# Patient Record
Sex: Female | Born: 1945 | Race: White | Hispanic: No | Marital: Single | Smoking: Never smoker
Health system: Southern US, Community
[De-identification: ages and names within clinical notes are randomized; demographics above are authoritative.]

## PROBLEM LIST (undated history)

## (undated) DIAGNOSIS — F0390 Unspecified dementia without behavioral disturbance: Secondary | ICD-10-CM

## (undated) DIAGNOSIS — E119 Type 2 diabetes mellitus without complications: Secondary | ICD-10-CM

---
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
## 2004-06-05 ENCOUNTER — Ambulatory Visit: Payer: Self-pay | Admitting: Obstetrics and Gynecology

## 2004-08-24 ENCOUNTER — Ambulatory Visit: Payer: Self-pay | Admitting: Unknown Physician Specialty

## 2005-02-01 ENCOUNTER — Emergency Department: Payer: Self-pay | Admitting: Unknown Physician Specialty

## 2005-04-19 ENCOUNTER — Other Ambulatory Visit: Payer: Self-pay

## 2005-04-19 ENCOUNTER — Inpatient Hospital Stay: Payer: Self-pay | Admitting: Internal Medicine

## 2005-05-17 ENCOUNTER — Ambulatory Visit: Payer: Self-pay | Admitting: Family Medicine

## 2005-07-31 ENCOUNTER — Ambulatory Visit: Payer: Self-pay | Admitting: Pain Medicine

## 2005-08-28 ENCOUNTER — Ambulatory Visit: Payer: Self-pay | Admitting: Pain Medicine

## 2005-10-28 ENCOUNTER — Encounter: Payer: Self-pay | Admitting: Orthopedic Surgery

## 2006-07-01 ENCOUNTER — Ambulatory Visit: Payer: Self-pay | Admitting: Obstetrics and Gynecology

## 2007-07-30 ENCOUNTER — Ambulatory Visit: Payer: Self-pay | Admitting: Internal Medicine

## 2007-08-10 ENCOUNTER — Other Ambulatory Visit: Payer: Self-pay

## 2007-08-10 ENCOUNTER — Inpatient Hospital Stay: Payer: Self-pay | Admitting: Surgery

## 2007-08-30 ENCOUNTER — Ambulatory Visit: Payer: Self-pay | Admitting: Internal Medicine

## 2007-09-09 ENCOUNTER — Ambulatory Visit: Payer: Self-pay | Admitting: Surgery

## 2008-02-25 ENCOUNTER — Ambulatory Visit: Payer: Self-pay | Admitting: Obstetrics and Gynecology

## 2009-11-29 ENCOUNTER — Ambulatory Visit: Payer: Self-pay | Admitting: Obstetrics and Gynecology

## 2011-01-23 ENCOUNTER — Ambulatory Visit: Payer: Self-pay | Admitting: Obstetrics and Gynecology

## 2011-07-07 ENCOUNTER — Emergency Department: Payer: Self-pay | Admitting: Unknown Physician Specialty

## 2012-02-25 ENCOUNTER — Ambulatory Visit: Payer: Self-pay | Admitting: Obstetrics and Gynecology

## 2013-03-04 ENCOUNTER — Ambulatory Visit: Payer: Self-pay | Admitting: Obstetrics and Gynecology

## 2014-03-28 ENCOUNTER — Ambulatory Visit: Payer: Self-pay | Admitting: Obstetrics and Gynecology

## 2014-09-09 ENCOUNTER — Ambulatory Visit: Payer: Self-pay | Admitting: Unknown Physician Specialty

## 2014-11-21 LAB — SURGICAL PATHOLOGY

## 2015-03-09 ENCOUNTER — Other Ambulatory Visit: Payer: Self-pay | Admitting: Obstetrics and Gynecology

## 2015-03-09 DIAGNOSIS — Z1231 Encounter for screening mammogram for malignant neoplasm of breast: Secondary | ICD-10-CM

## 2015-03-30 ENCOUNTER — Ambulatory Visit
Admission: RE | Admit: 2015-03-30 | Discharge: 2015-03-30 | Disposition: A | Discharge status: Home / Self Care | Payer: PPO | Source: Ambulatory Visit | Attending: Obstetrics and Gynecology | Admitting: Obstetrics and Gynecology

## 2015-03-30 DIAGNOSIS — Z1231 Encounter for screening mammogram for malignant neoplasm of breast: Secondary | ICD-10-CM | POA: Diagnosis present

## 2015-09-01 DIAGNOSIS — I10 Essential (primary) hypertension: Secondary | ICD-10-CM | POA: Diagnosis not present

## 2015-09-01 DIAGNOSIS — E785 Hyperlipidemia, unspecified: Secondary | ICD-10-CM | POA: Diagnosis not present

## 2015-09-01 DIAGNOSIS — E119 Type 2 diabetes mellitus without complications: Secondary | ICD-10-CM | POA: Diagnosis not present

## 2015-09-13 DIAGNOSIS — H25013 Cortical age-related cataract, bilateral: Secondary | ICD-10-CM | POA: Diagnosis not present

## 2015-09-13 DIAGNOSIS — E119 Type 2 diabetes mellitus without complications: Secondary | ICD-10-CM | POA: Diagnosis not present

## 2015-09-13 DIAGNOSIS — H5211 Myopia, right eye: Secondary | ICD-10-CM | POA: Diagnosis not present

## 2015-09-13 DIAGNOSIS — H35372 Puckering of macula, left eye: Secondary | ICD-10-CM | POA: Diagnosis not present

## 2015-09-28 DIAGNOSIS — H2511 Age-related nuclear cataract, right eye: Secondary | ICD-10-CM | POA: Diagnosis not present

## 2015-09-28 DIAGNOSIS — H25011 Cortical age-related cataract, right eye: Secondary | ICD-10-CM | POA: Diagnosis not present

## 2015-09-28 DIAGNOSIS — H25811 Combined forms of age-related cataract, right eye: Secondary | ICD-10-CM | POA: Diagnosis not present

## 2016-02-16 ENCOUNTER — Other Ambulatory Visit: Payer: Self-pay | Admitting: Family Medicine

## 2016-02-16 DIAGNOSIS — Z1231 Encounter for screening mammogram for malignant neoplasm of breast: Secondary | ICD-10-CM

## 2016-03-12 DIAGNOSIS — Z1211 Encounter for screening for malignant neoplasm of colon: Secondary | ICD-10-CM | POA: Diagnosis not present

## 2016-03-12 DIAGNOSIS — Z124 Encounter for screening for malignant neoplasm of cervix: Secondary | ICD-10-CM | POA: Diagnosis not present

## 2016-03-12 DIAGNOSIS — Z01419 Encounter for gynecological examination (general) (routine) without abnormal findings: Secondary | ICD-10-CM | POA: Diagnosis not present

## 2016-03-21 DIAGNOSIS — Z0000 Encounter for general adult medical examination without abnormal findings: Secondary | ICD-10-CM | POA: Diagnosis not present

## 2016-03-21 DIAGNOSIS — E119 Type 2 diabetes mellitus without complications: Secondary | ICD-10-CM | POA: Diagnosis not present

## 2016-03-21 DIAGNOSIS — E785 Hyperlipidemia, unspecified: Secondary | ICD-10-CM | POA: Diagnosis not present

## 2016-03-21 DIAGNOSIS — I10 Essential (primary) hypertension: Secondary | ICD-10-CM | POA: Diagnosis not present

## 2016-04-02 ENCOUNTER — Ambulatory Visit
Admission: RE | Admit: 2016-04-02 | Discharge: 2016-04-02 | Disposition: A | Discharge status: Home / Self Care | Payer: PPO | Source: Ambulatory Visit | Attending: Family Medicine | Admitting: Family Medicine

## 2016-04-02 ENCOUNTER — Other Ambulatory Visit: Payer: Self-pay | Admitting: Family Medicine

## 2016-04-02 DIAGNOSIS — Z1231 Encounter for screening mammogram for malignant neoplasm of breast: Secondary | ICD-10-CM | POA: Insufficient documentation

## 2016-05-07 DIAGNOSIS — R748 Abnormal levels of other serum enzymes: Secondary | ICD-10-CM | POA: Diagnosis not present

## 2016-09-20 DIAGNOSIS — E785 Hyperlipidemia, unspecified: Secondary | ICD-10-CM | POA: Diagnosis not present

## 2016-09-20 DIAGNOSIS — E119 Type 2 diabetes mellitus without complications: Secondary | ICD-10-CM | POA: Diagnosis not present

## 2016-09-20 DIAGNOSIS — I10 Essential (primary) hypertension: Secondary | ICD-10-CM | POA: Diagnosis not present

## 2016-12-24 ENCOUNTER — Emergency Department
Admission: EM | Admit: 2016-12-24 | Discharge: 2016-12-24 | Disposition: A | Discharge status: Home / Self Care | Payer: PPO | Attending: Emergency Medicine | Admitting: Emergency Medicine

## 2016-12-24 ENCOUNTER — Emergency Department: Payer: PPO

## 2016-12-24 DIAGNOSIS — R4182 Altered mental status, unspecified: Secondary | ICD-10-CM | POA: Diagnosis not present

## 2016-12-24 DIAGNOSIS — I10 Essential (primary) hypertension: Secondary | ICD-10-CM | POA: Diagnosis not present

## 2016-12-24 DIAGNOSIS — N390 Urinary tract infection, site not specified: Secondary | ICD-10-CM | POA: Diagnosis not present

## 2016-12-24 DIAGNOSIS — R748 Abnormal levels of other serum enzymes: Secondary | ICD-10-CM | POA: Diagnosis not present

## 2016-12-24 DIAGNOSIS — R410 Disorientation, unspecified: Secondary | ICD-10-CM

## 2016-12-24 LAB — URINALYSIS, COMPLETE (UACMP) WITH MICROSCOPIC
Bilirubin Urine: NEGATIVE
Glucose, UA: NEGATIVE mg/dL
Hgb urine dipstick: NEGATIVE
Ketones, ur: NEGATIVE mg/dL
Nitrite: NEGATIVE
Protein, ur: NEGATIVE mg/dL
Specific Gravity, Urine: 1.004 — ABNORMAL LOW (ref 1.005–1.030)
pH: 7 (ref 5.0–8.0)

## 2016-12-24 LAB — COMPREHENSIVE METABOLIC PANEL
ALT: 40 U/L (ref 14–54)
AST: 38 U/L (ref 15–41)
Albumin: 4.8 g/dL (ref 3.5–5.0)
Alkaline Phosphatase: 61 U/L (ref 38–126)
Anion gap: 9 (ref 5–15)
BUN: 14 mg/dL (ref 6–20)
CO2: 26 mmol/L (ref 22–32)
Calcium: 10.1 mg/dL (ref 8.9–10.3)
Chloride: 105 mmol/L (ref 101–111)
Creatinine, Ser: 1 mg/dL (ref 0.44–1.00)
GFR calc Af Amer: >60 mL/min (ref >60)
GFR calc non Af Amer: 55 mL/min — ABNORMAL LOW (ref >60)
Glucose, Bld: 118 mg/dL — ABNORMAL HIGH (ref 65–99)
Potassium: 4 mmol/L (ref 3.5–5.1)
Sodium: 140 mmol/L (ref 135–145)
Total Bilirubin: 0.5 mg/dL (ref 0.3–1.2)
Total Protein: 7.6 g/dL (ref 6.5–8.1)

## 2016-12-24 LAB — CBC WITH DIFFERENTIAL/PLATELET
Basophils Absolute: 0 10*3/uL (ref 0–0.1)
Basophils Relative: 0 %
Eosinophils Absolute: 0.1 10*3/uL (ref 0–0.7)
Eosinophils Relative: 1 %
HCT: 38.4 % (ref 35.0–47.0)
Hemoglobin: 13.5 g/dL (ref 12.0–16.0)
Lymphocytes Relative: 16 %
Lymphs Abs: 1.1 10*3/uL (ref 1.0–3.6)
MCH: 32.9 pg (ref 26.0–34.0)
MCHC: 35.2 g/dL (ref 32.0–36.0)
MCV: 93.3 fL (ref 80.0–100.0)
Monocytes Absolute: 0.3 10*3/uL (ref 0.2–0.9)
Monocytes Relative: 5 %
Neutro Abs: 5.3 10*3/uL (ref 1.4–6.5)
Neutrophils Relative %: 78 %
Platelets: 152 10*3/uL (ref 150–440)
RBC: 4.11 MIL/uL (ref 3.80–5.20)
RDW: 12.3 % (ref 11.5–14.5)
WBC: 6.9 10*3/uL (ref 3.6–11.0)

## 2016-12-24 LAB — TROPONIN I: Troponin I: <0.03 ng/mL (ref <0.03)

## 2016-12-24 MED ORDER — CEFTRIAXONE SODIUM IN DEXTROSE 20 MG/ML IV SOLN
1.0000 g | Freq: Once | INTRAVENOUS | Status: AC
Start: 2016-12-24 — End: 2016-12-24
  Administered 2016-12-24: 1 g via INTRAVENOUS
  Filled 2016-12-24: qty 50

## 2016-12-24 MED ORDER — CEPHALEXIN 250 MG PO CAPS: 250.0000 mg | ORAL_CAPSULE | Freq: Four times a day (QID) | ORAL | 0 refills | Status: AC

## 2016-12-24 NOTE — ED Notes (Signed)
Pt resting in bed, returned from CT, awake and alert

## 2016-12-24 NOTE — ED Notes (Signed)
pts neighbor called for ride

## 2016-12-24 NOTE — ED Triage Notes (Signed)
Pt arrives via EMS from [facility], states she was there for a regular check up and they noticed AMS, diagnosed pt with a UTI and sent to ED, upon arrival pt oriented to name, unable to recall president, repeats "I am an outdoor person, I cut everyone's grass", denies any pain, awake and alert

## 2016-12-24 NOTE — ED Provider Notes (Signed)
[facility] Emergency Department Provider Note       Time seen: ----------------------------------------- 1:52 PM on 12/24/2016 -----------------------------------------     I have reviewed the triage vital signs and the nursing notes.   HISTORY   Chief Complaint Altered Mental Status    HPI Deborah Thornton is a 71 y.o. female who presents to the ED for altered mental status. Patient is sent via EMS from [facility] where she was there for regular checkup. They noticed altered mental status. She was diagnosed with UTI and sent to ER for evaluation. Upon arrival she has no complaints and states she feels fine.   No past medical history on file.  There are no active problems to display for this patient.   No past surgical history on file.  Allergies Patient has no known allergies.  Social History Social History  Substance Use Topics  . Smoking status: Not on file  . Smokeless tobacco: Not on file  . Alcohol use Not on file    Review of Systems Constitutional: Negative for fever. Eyes: Negative for vision changes ENT:  Negative for congestion, sore throat Cardiovascular: Negative for chest pain. Respiratory: Negative for shortness of breath. Gastrointestinal: Negative for abdominal pain, vomiting and diarrhea. Genitourinary: Negative for dysuria. Musculoskeletal: Negative for back pain. Skin: Negative for rash. Neurological: Negative for headaches, focal weakness or numbness.  All systems negative/normal/unremarkable except as stated in the HPI  ____________________________________________   PHYSICAL EXAM:  VITAL SIGNS: ED Triage Vitals  Enc Vitals Group     BP 12/24/16 1329 120/64     Pulse Rate 12/24/16 1329 63     Resp 12/24/16 1329 16     Temp 12/24/16 1329 97.7 F (36.5 C)     Temp Source 12/24/16 1329 Oral     SpO2 12/24/16 1329 100 %     Weight 12/24/16 1325 117 lb 11.6 oz (53.4 kg)     Height 12/24/16 1325 5'  2" (1.575 m)     Head Circumference --      Peak Flow --      Pain Score --      Pain Loc --      Pain Edu? --      Excl. in [place]? --     Constitutional: Alert But disoriented, Well appearing and in no distress. Eyes: Conjunctivae are normal. Normal extraocular movements. ENT   Head: Normocephalic and atraumatic.   Nose: No congestion/rhinnorhea.   Mouth/Throat: Mucous membranes are moist.   Neck: No stridor. Cardiovascular: Normal rate, regular rhythm. No murmurs, rubs, or gallops. Respiratory: Normal respiratory effort without tachypnea nor retractions. Breath sounds are clear and equal bilaterally. No wheezes/rales/rhonchi. Gastrointestinal: Soft and nontender. Normal bowel sounds Musculoskeletal: Nontender with normal range of motion in extremities. No lower extremity tenderness nor edema. Neurologic:  Normal speech and language. No gross focal neurologic deficits are appreciated.  Skin:  Skin is warm, dry and intact. No rash noted. Psychiatric: Mood and affect are normal. Speech and behavior are normal.  ____________________________________________  ED COURSE:  Pertinent labs & imaging results that were available during my care of the patient were reviewed by me and considered in my medical decision making (see chart for details). Patient presents for reported altered mental status, we will assess with labs and imaging as indicated.   Procedures ____________________________________________   LABS (pertinent positives/negatives)  Labs Reviewed  COMPREHENSIVE METABOLIC PANEL - Abnormal; Notable for the following:       Result Value  Glucose, Bld 118 (*)    GFR calc non Af Amer 55 (*)    All other components within normal limits  URINALYSIS, COMPLETE (UACMP) WITH MICROSCOPIC - Abnormal; Notable for the following:    Color, Urine STRAW (*)    APPearance CLEAR (*)    Specific Gravity, Urine 1.004 (*)    Leukocytes, UA SMALL (*)    Bacteria, UA RARE (*)     Squamous Epithelial / LPF 0-5 (*)    All other components within normal limits  CBC WITH DIFFERENTIAL/PLATELET  TROPONIN I  CBG MONITORING, ED    RADIOLOGY Images were viewed by me  CT head IMPRESSION:  Mild diffuse cortical atrophy. No acute intracranial abnormality  seen.      ____________________________________________  FINAL ASSESSMENT AND PLAN  Confusion, UTI  Plan: Patient's labs and imaging were dictated above. Patient had presented for concerns about her level of confusion. She appears mildly confused as if she is developing dementia but the symptoms are only mild at this point. She has excellent recall for most things and we have given her Rocephin for treatment of UTI. She'll be discharged to encourage taking Macrobid she was prescribed today.   [name], MD   Note: This note was generated in part or whole with voice recognition software. Voice recognition is usually quite accurate but there are transcription errors that can and very often do occur. I apologize for any typographical errors that were not detected and corrected.     [name], MD 12/24/16 [phone]

## 2016-12-24 NOTE — ED Notes (Signed)
Patient transported to CT 

## 2017-01-07 DIAGNOSIS — E538 Deficiency of other specified B group vitamins: Secondary | ICD-10-CM | POA: Diagnosis not present

## 2017-01-07 DIAGNOSIS — F0390 Unspecified dementia without behavioral disturbance: Secondary | ICD-10-CM | POA: Diagnosis not present

## 2017-01-07 DIAGNOSIS — R413 Other amnesia: Secondary | ICD-10-CM | POA: Diagnosis not present

## 2017-03-03 DIAGNOSIS — I10 Essential (primary) hypertension: Secondary | ICD-10-CM | POA: Diagnosis not present

## 2017-03-03 DIAGNOSIS — E785 Hyperlipidemia, unspecified: Secondary | ICD-10-CM | POA: Diagnosis not present

## 2017-03-03 DIAGNOSIS — E119 Type 2 diabetes mellitus without complications: Secondary | ICD-10-CM | POA: Diagnosis not present

## 2017-03-03 DIAGNOSIS — R413 Other amnesia: Secondary | ICD-10-CM | POA: Diagnosis not present

## 2017-03-13 ENCOUNTER — Other Ambulatory Visit: Payer: Self-pay | Admitting: Obstetrics and Gynecology

## 2017-03-13 DIAGNOSIS — Z1231 Encounter for screening mammogram for malignant neoplasm of breast: Secondary | ICD-10-CM | POA: Diagnosis not present

## 2017-03-13 DIAGNOSIS — Z1211 Encounter for screening for malignant neoplasm of colon: Secondary | ICD-10-CM | POA: Diagnosis not present

## 2017-03-13 DIAGNOSIS — Z124 Encounter for screening for malignant neoplasm of cervix: Secondary | ICD-10-CM | POA: Diagnosis not present

## 2017-04-07 ENCOUNTER — Ambulatory Visit
Admission: RE | Admit: 2017-04-07 | Discharge: 2017-04-07 | Disposition: A | Discharge status: Home / Self Care | Payer: PPO | Source: Ambulatory Visit | Attending: Obstetrics and Gynecology | Admitting: Obstetrics and Gynecology

## 2017-04-07 DIAGNOSIS — Z1231 Encounter for screening mammogram for malignant neoplasm of breast: Secondary | ICD-10-CM

## 2017-04-09 DIAGNOSIS — F0280 Dementia in other diseases classified elsewhere without behavioral disturbance: Secondary | ICD-10-CM | POA: Diagnosis not present

## 2017-04-09 DIAGNOSIS — G309 Alzheimer's disease, unspecified: Secondary | ICD-10-CM | POA: Diagnosis not present

## 2017-05-19 DIAGNOSIS — G309 Alzheimer's disease, unspecified: Secondary | ICD-10-CM | POA: Diagnosis not present

## 2017-05-19 DIAGNOSIS — I10 Essential (primary) hypertension: Secondary | ICD-10-CM | POA: Diagnosis not present

## 2017-05-19 DIAGNOSIS — F0280 Dementia in other diseases classified elsewhere without behavioral disturbance: Secondary | ICD-10-CM | POA: Diagnosis not present

## 2017-05-19 DIAGNOSIS — E119 Type 2 diabetes mellitus without complications: Secondary | ICD-10-CM | POA: Diagnosis not present

## 2017-07-26 DIAGNOSIS — Z9181 History of falling: Secondary | ICD-10-CM | POA: Diagnosis not present

## 2017-07-26 DIAGNOSIS — S99922A Unspecified injury of left foot, initial encounter: Secondary | ICD-10-CM | POA: Diagnosis not present

## 2017-07-26 DIAGNOSIS — M19072 Primary osteoarthritis, left ankle and foot: Secondary | ICD-10-CM | POA: Diagnosis not present

## 2017-07-26 DIAGNOSIS — S99912A Unspecified injury of left ankle, initial encounter: Secondary | ICD-10-CM | POA: Diagnosis not present

## 2017-09-12 DIAGNOSIS — E782 Mixed hyperlipidemia: Secondary | ICD-10-CM | POA: Diagnosis not present

## 2017-09-12 DIAGNOSIS — E118 Type 2 diabetes mellitus with unspecified complications: Secondary | ICD-10-CM | POA: Diagnosis not present

## 2017-09-12 DIAGNOSIS — I10 Essential (primary) hypertension: Secondary | ICD-10-CM | POA: Diagnosis not present

## 2017-09-19 DIAGNOSIS — I10 Essential (primary) hypertension: Secondary | ICD-10-CM | POA: Diagnosis not present

## 2017-09-19 DIAGNOSIS — E785 Hyperlipidemia, unspecified: Secondary | ICD-10-CM | POA: Diagnosis not present

## 2017-09-19 DIAGNOSIS — G309 Alzheimer's disease, unspecified: Secondary | ICD-10-CM | POA: Diagnosis not present

## 2017-09-19 DIAGNOSIS — E119 Type 2 diabetes mellitus without complications: Secondary | ICD-10-CM | POA: Diagnosis not present

## 2017-09-19 DIAGNOSIS — F0280 Dementia in other diseases classified elsewhere without behavioral disturbance: Secondary | ICD-10-CM | POA: Diagnosis not present

## 2017-10-07 DIAGNOSIS — R413 Other amnesia: Secondary | ICD-10-CM | POA: Diagnosis not present

## 2017-12-03 DIAGNOSIS — G309 Alzheimer's disease, unspecified: Secondary | ICD-10-CM | POA: Diagnosis not present

## 2017-12-03 DIAGNOSIS — E538 Deficiency of other specified B group vitamins: Secondary | ICD-10-CM | POA: Diagnosis not present

## 2017-12-03 DIAGNOSIS — F0280 Dementia in other diseases classified elsewhere without behavioral disturbance: Secondary | ICD-10-CM | POA: Diagnosis not present

## 2018-01-19 DIAGNOSIS — F0280 Dementia in other diseases classified elsewhere without behavioral disturbance: Secondary | ICD-10-CM | POA: Diagnosis not present

## 2018-01-19 DIAGNOSIS — I10 Essential (primary) hypertension: Secondary | ICD-10-CM | POA: Diagnosis not present

## 2018-01-19 DIAGNOSIS — E119 Type 2 diabetes mellitus without complications: Secondary | ICD-10-CM | POA: Diagnosis not present

## 2018-01-19 DIAGNOSIS — G309 Alzheimer's disease, unspecified: Secondary | ICD-10-CM | POA: Diagnosis not present

## 2018-01-19 DIAGNOSIS — E785 Hyperlipidemia, unspecified: Secondary | ICD-10-CM | POA: Diagnosis not present

## 2018-03-17 ENCOUNTER — Other Ambulatory Visit: Payer: Self-pay | Admitting: Obstetrics and Gynecology

## 2018-03-17 DIAGNOSIS — Z124 Encounter for screening for malignant neoplasm of cervix: Secondary | ICD-10-CM | POA: Diagnosis not present

## 2018-03-17 DIAGNOSIS — Z1231 Encounter for screening mammogram for malignant neoplasm of breast: Secondary | ICD-10-CM

## 2018-03-17 DIAGNOSIS — Z1211 Encounter for screening for malignant neoplasm of colon: Secondary | ICD-10-CM | POA: Diagnosis not present

## 2018-03-17 DIAGNOSIS — Z1239 Encounter for other screening for malignant neoplasm of breast: Secondary | ICD-10-CM | POA: Diagnosis not present

## 2018-03-23 DIAGNOSIS — Z1211 Encounter for screening for malignant neoplasm of colon: Secondary | ICD-10-CM | POA: Diagnosis not present

## 2018-04-08 ENCOUNTER — Ambulatory Visit
Admission: RE | Admit: 2018-04-08 | Discharge: 2018-04-08 | Disposition: A | Discharge status: Home / Self Care | Payer: PPO | Source: Ambulatory Visit | Attending: Obstetrics and Gynecology | Admitting: Obstetrics and Gynecology

## 2018-04-08 DIAGNOSIS — Z1231 Encounter for screening mammogram for malignant neoplasm of breast: Secondary | ICD-10-CM | POA: Diagnosis not present

## 2018-06-03 DIAGNOSIS — F0280 Dementia in other diseases classified elsewhere without behavioral disturbance: Secondary | ICD-10-CM | POA: Diagnosis not present

## 2018-06-03 DIAGNOSIS — G309 Alzheimer's disease, unspecified: Secondary | ICD-10-CM | POA: Diagnosis not present

## 2018-06-03 DIAGNOSIS — R413 Other amnesia: Secondary | ICD-10-CM | POA: Diagnosis not present

## 2018-09-08 DIAGNOSIS — R413 Other amnesia: Secondary | ICD-10-CM | POA: Diagnosis not present

## 2018-09-08 DIAGNOSIS — F0280 Dementia in other diseases classified elsewhere without behavioral disturbance: Secondary | ICD-10-CM | POA: Diagnosis not present

## 2018-09-08 DIAGNOSIS — G309 Alzheimer's disease, unspecified: Secondary | ICD-10-CM | POA: Diagnosis not present

## 2018-12-07 DIAGNOSIS — E119 Type 2 diabetes mellitus without complications: Secondary | ICD-10-CM | POA: Diagnosis not present

## 2018-12-07 DIAGNOSIS — G309 Alzheimer's disease, unspecified: Secondary | ICD-10-CM | POA: Diagnosis not present

## 2018-12-07 DIAGNOSIS — E785 Hyperlipidemia, unspecified: Secondary | ICD-10-CM | POA: Diagnosis not present

## 2018-12-07 DIAGNOSIS — I10 Essential (primary) hypertension: Secondary | ICD-10-CM | POA: Diagnosis not present

## 2018-12-10 DIAGNOSIS — E785 Hyperlipidemia, unspecified: Secondary | ICD-10-CM | POA: Diagnosis not present

## 2018-12-10 DIAGNOSIS — E119 Type 2 diabetes mellitus without complications: Secondary | ICD-10-CM | POA: Diagnosis not present

## 2018-12-10 DIAGNOSIS — I10 Essential (primary) hypertension: Secondary | ICD-10-CM | POA: Diagnosis not present

## 2020-03-31 DIAGNOSIS — R946 Abnormal results of thyroid function studies: Secondary | ICD-10-CM | POA: Diagnosis not present

## 2020-03-31 DIAGNOSIS — E119 Type 2 diabetes mellitus without complications: Secondary | ICD-10-CM | POA: Diagnosis not present

## 2020-06-26 DIAGNOSIS — R634 Abnormal weight loss: Secondary | ICD-10-CM | POA: Diagnosis not present

## 2020-06-26 DIAGNOSIS — Z1389 Encounter for screening for other disorder: Secondary | ICD-10-CM | POA: Diagnosis not present

## 2020-06-26 DIAGNOSIS — E039 Hypothyroidism, unspecified: Secondary | ICD-10-CM | POA: Diagnosis not present

## 2020-06-26 DIAGNOSIS — E119 Type 2 diabetes mellitus without complications: Secondary | ICD-10-CM | POA: Diagnosis not present

## 2020-06-26 DIAGNOSIS — E559 Vitamin D deficiency, unspecified: Secondary | ICD-10-CM | POA: Diagnosis not present

## 2020-06-26 DIAGNOSIS — I10 Essential (primary) hypertension: Secondary | ICD-10-CM | POA: Diagnosis not present

## 2020-06-26 DIAGNOSIS — Z23 Encounter for immunization: Secondary | ICD-10-CM | POA: Diagnosis not present

## 2020-06-26 DIAGNOSIS — Z0000 Encounter for general adult medical examination without abnormal findings: Secondary | ICD-10-CM | POA: Diagnosis not present

## 2020-07-06 ENCOUNTER — Encounter: Payer: Self-pay | Admitting: Emergency Medicine

## 2020-07-06 ENCOUNTER — Inpatient Hospital Stay
Admission: EM | Admit: 2020-07-06 | Discharge: 2020-07-08 | DRG: 639 | Disposition: A | Discharge status: Home / Self Care | Payer: Medicare Other | Attending: Family Medicine | Admitting: Family Medicine

## 2020-07-06 ENCOUNTER — Emergency Department: Payer: Medicare Other

## 2020-07-06 ENCOUNTER — Other Ambulatory Visit: Payer: Self-pay

## 2020-07-06 DIAGNOSIS — G309 Alzheimer's disease, unspecified: Secondary | ICD-10-CM | POA: Diagnosis not present

## 2020-07-06 DIAGNOSIS — E119 Type 2 diabetes mellitus without complications: Secondary | ICD-10-CM | POA: Diagnosis not present

## 2020-07-06 DIAGNOSIS — F0280 Dementia in other diseases classified elsewhere without behavioral disturbance: Secondary | ICD-10-CM | POA: Diagnosis present

## 2020-07-06 DIAGNOSIS — E039 Hypothyroidism, unspecified: Secondary | ICD-10-CM | POA: Diagnosis present

## 2020-07-06 DIAGNOSIS — E785 Hyperlipidemia, unspecified: Secondary | ICD-10-CM | POA: Diagnosis present

## 2020-07-06 DIAGNOSIS — R4182 Altered mental status, unspecified: Secondary | ICD-10-CM

## 2020-07-06 DIAGNOSIS — I10 Essential (primary) hypertension: Secondary | ICD-10-CM | POA: Diagnosis present

## 2020-07-06 DIAGNOSIS — E1110 Type 2 diabetes mellitus with ketoacidosis without coma: Principal | ICD-10-CM | POA: Diagnosis present

## 2020-07-06 DIAGNOSIS — Z7984 Long term (current) use of oral hypoglycemic drugs: Secondary | ICD-10-CM

## 2020-07-06 DIAGNOSIS — Z66 Do not resuscitate: Secondary | ICD-10-CM | POA: Diagnosis not present

## 2020-07-06 DIAGNOSIS — E1159 Type 2 diabetes mellitus with other circulatory complications: Secondary | ICD-10-CM | POA: Diagnosis present

## 2020-07-06 DIAGNOSIS — Z20822 Contact with and (suspected) exposure to covid-19: Secondary | ICD-10-CM | POA: Diagnosis present

## 2020-07-06 DIAGNOSIS — E1169 Type 2 diabetes mellitus with other specified complication: Secondary | ICD-10-CM | POA: Diagnosis present

## 2020-07-06 HISTORY — DX: Unspecified dementia, unspecified severity, without behavioral disturbance, psychotic disturbance, mood disturbance, and anxiety: F03.90

## 2020-07-06 HISTORY — DX: Type 2 diabetes mellitus without complications: E11.9

## 2020-07-06 LAB — BASIC METABOLIC PANEL
Anion gap: 23 — ABNORMAL HIGH (ref 5–15)
Anion gap: 20 — ABNORMAL HIGH (ref 5–15)
Anion gap: 13 (ref 5–15)
BUN: 35 mg/dL — ABNORMAL HIGH (ref 8–23)
BUN: 30 mg/dL — ABNORMAL HIGH (ref 8–23)
BUN: 23 mg/dL (ref 8–23)
CO2: 18 mmol/L — ABNORMAL LOW (ref 22–32)
CO2: 18 mmol/L — ABNORMAL LOW (ref 22–32)
CO2: 20 mmol/L — ABNORMAL LOW (ref 22–32)
Calcium: 10 mg/dL (ref 8.9–10.3)
Calcium: 9 mg/dL (ref 8.9–10.3)
Calcium: 8.3 mg/dL — ABNORMAL LOW (ref 8.9–10.3)
Chloride: 91 mmol/L — ABNORMAL LOW (ref 98–111)
Chloride: 97 mmol/L — ABNORMAL LOW (ref 98–111)
Chloride: 103 mmol/L (ref 98–111)
Creatinine, Ser: 1.07 mg/dL — ABNORMAL HIGH (ref 0.44–1.00)
Creatinine, Ser: 0.85 mg/dL (ref 0.44–1.00)
Creatinine, Ser: 0.59 mg/dL (ref 0.44–1.00)
GFR, Estimated: 55 mL/min — ABNORMAL LOW (ref >60)
GFR, Estimated: >60 mL/min (ref >60)
GFR, Estimated: >60 mL/min (ref >60)
Glucose, Bld: 537 mg/dL (ref 70–99)
Glucose, Bld: 456 mg/dL — ABNORMAL HIGH (ref 70–99)
Glucose, Bld: 272 mg/dL — ABNORMAL HIGH (ref 70–99)
Potassium: 5.1 mmol/L (ref 3.5–5.1)
Potassium: 4.7 mmol/L (ref 3.5–5.1)
Potassium: 3.9 mmol/L (ref 3.5–5.1)
Sodium: 132 mmol/L — ABNORMAL LOW (ref 135–145)
Sodium: 135 mmol/L (ref 135–145)
Sodium: 136 mmol/L (ref 135–145)

## 2020-07-06 LAB — CBG MONITORING, ED
Glucose-Capillary: 506 mg/dL (ref 70–99)
Glucose-Capillary: 443 mg/dL — ABNORMAL HIGH (ref 70–99)
Glucose-Capillary: 434 mg/dL — ABNORMAL HIGH (ref 70–99)
Glucose-Capillary: 282 mg/dL — ABNORMAL HIGH (ref 70–99)
Glucose-Capillary: 345 mg/dL — ABNORMAL HIGH (ref 70–99)
Glucose-Capillary: 250 mg/dL — ABNORMAL HIGH (ref 70–99)

## 2020-07-06 LAB — CBC
HCT: 43.9 % (ref 36.0–46.0)
Hemoglobin: 15.2 g/dL — ABNORMAL HIGH (ref 12.0–15.0)
MCH: 32.5 pg (ref 26.0–34.0)
MCHC: 34.6 g/dL (ref 30.0–36.0)
MCV: 94 fL (ref 80.0–100.0)
Platelets: 189 10*3/uL (ref 150–400)
RBC: 4.67 MIL/uL (ref 3.87–5.11)
RDW: 14.4 % (ref 11.5–15.5)
WBC: 8.9 10*3/uL (ref 4.0–10.5)
nRBC: 0 % (ref 0.0–0.2)

## 2020-07-06 LAB — URINALYSIS, COMPLETE (UACMP) WITH MICROSCOPIC
Bacteria, UA: NONE SEEN
Bilirubin Urine: NEGATIVE
Glucose, UA: >=500 mg/dL — AB
Ketones, ur: 80 mg/dL — AB
Nitrite: NEGATIVE
Protein, ur: NEGATIVE mg/dL
Specific Gravity, Urine: 1.027 (ref 1.005–1.030)
Squamous Epithelial / LPF: NONE SEEN (ref 0–5)
pH: 5 (ref 5.0–8.0)

## 2020-07-06 LAB — RESP PANEL BY RT-PCR (FLU A&B, COVID) ARPGX2
Influenza A by PCR: NEGATIVE
Influenza B by PCR: NEGATIVE
SARS Coronavirus 2 by RT PCR: NEGATIVE

## 2020-07-06 LAB — BETA-HYDROXYBUTYRIC ACID: Beta-Hydroxybutyric Acid: 5.63 mmol/L — ABNORMAL HIGH (ref 0.05–0.27)

## 2020-07-06 LAB — MAGNESIUM: Magnesium: 1.9 mg/dL (ref 1.7–2.4)

## 2020-07-06 MED ORDER — DONEPEZIL HCL 5 MG PO TABS
10.0000 mg | ORAL_TABLET | Freq: Every day | ORAL | Status: DC
  Administered 2020-07-07: 10 mg via ORAL
  Filled 2020-07-06: qty 2

## 2020-07-06 MED ORDER — DEXTROSE IN LACTATED RINGERS 5 % IV SOLN: INTRAVENOUS | Status: DC

## 2020-07-06 MED ORDER — DEXTROSE 50 % IV SOLN: 0.0000 mL | INTRAVENOUS | Status: DC | PRN

## 2020-07-06 MED ORDER — INSULIN REGULAR(HUMAN) IN NACL 100-0.9 UT/100ML-% IV SOLN
INTRAVENOUS | Status: DC
  Administered 2020-07-06: 6 [IU]/h via INTRAVENOUS
  Filled 2020-07-06: qty 100

## 2020-07-06 MED ORDER — LACTATED RINGERS IV BOLUS
1000.0000 mL | Freq: Once | INTRAVENOUS | Status: AC
Start: 2020-07-06 — End: 2020-07-07
  Administered 2020-07-06: 1000 mL via INTRAVENOUS

## 2020-07-06 MED ORDER — MEMANTINE HCL 5 MG PO TABS
10.0000 mg | ORAL_TABLET | Freq: Two times a day (BID) | ORAL | Status: DC
  Administered 2020-07-06 – 2020-07-07 (×3): 10 mg via ORAL
  Filled 2020-07-06 (×3): qty 2

## 2020-07-06 MED ORDER — ENOXAPARIN SODIUM 30 MG/0.3ML ~~LOC~~ SOLN
30.0000 mg | SUBCUTANEOUS | Status: DC
  Administered 2020-07-06 – 2020-07-07 (×2): 30 mg via SUBCUTANEOUS
  Filled 2020-07-06 (×3): qty 0.3

## 2020-07-06 MED ORDER — LEVOTHYROXINE SODIUM 25 MCG PO TABS
25.0000 ug | ORAL_TABLET | Freq: Every day | ORAL | Status: DC
  Administered 2020-07-07 – 2020-07-08 (×2): 25 ug via ORAL
  Filled 2020-07-06 (×2): qty 1

## 2020-07-06 MED ORDER — LACTATED RINGERS IV SOLN: INTRAVENOUS | Status: DC

## 2020-07-06 MED ORDER — SODIUM CHLORIDE 0.9 % IV BOLUS
1000.0000 mL | Freq: Once | INTRAVENOUS | Status: AC
Start: 2020-07-06 — End: 2020-07-06
  Administered 2020-07-06: 1000 mL via INTRAVENOUS

## 2020-07-06 MED ORDER — SIMVASTATIN 10 MG PO TABS
40.0000 mg | ORAL_TABLET | Freq: Every day | ORAL | Status: DC
  Administered 2020-07-07: 40 mg via ORAL
  Filled 2020-07-06: qty 4

## 2020-07-06 NOTE — ED Provider Notes (Addendum)
[facility] Emergency Department Provider Note   ____________________________________________   Event Date/Time   First MD Initiated Contact with Patient 07/06/20 1657     (approximate)  I have reviewed the triage vital signs and the nursing notes.   HISTORY  Chief Complaint Hyperglycemia    HPI Deborah Thornton is a 74 y.o. female with past medical history of hypertension, hyperlipidemia, diabetes, and dementia who presents to the ED for hyperglycemia.  History is limited due to patient's baseline dementia, majority of history is obtained from her ex-husband, who typically cares for the patient.  He states that she has been increasingly confused for the past couple of days with poor p.o. intake and increased thirst.  He also states that her blood sugar levels have been running in the 400s for the past couple of weeks, she continues to take Metformin but was recently taken off of her other diabetic medications.  Patient currently denies any complaints, is not sure why she is here.  She denies any abdominal pain, nausea, vomiting, dysuria, or hematuria.        Past Medical History:  Diagnosis Date  . Dementia ([place])   . Diabetes mellitus without complication ([place])     There are no problems to display for this patient.   No past surgical history on file.  Prior to Admission medications   Medication Sig Start Date End Date Taking? Authorizing Provider  DILT-XR 120 MG 24 hr capsule Take 120 mg by mouth daily. 12/11/16   [provider]  lisinopril (PRINIVIL,ZESTRIL) 10 MG tablet Take 10 mg by mouth 2 (two) times daily. 12/11/16   [provider]  metFORMIN (GLUCOPHAGE) 500 MG tablet Take 500 mg by mouth daily with breakfast.    [provider]  simvastatin (ZOCOR) 40 MG tablet Take 40 mg by mouth daily.    [provider]    Allergies Patient has no known allergies.  Family History  Problem Relation Age of Onset   . Cancer Mother     Social History Social History   Tobacco Use  . Smoking status: Never Smoker  . Smokeless tobacco: Never Used    Review of Systems Unable to obtain secondary to dementia  ____________________________________________   PHYSICAL EXAM:  VITAL SIGNS: ED Triage Vitals  Enc Vitals Group     BP 07/06/20 1541 118/64     Pulse Rate 07/06/20 1541 85     Resp 07/06/20 1541 20     Temp 07/06/20 1541 97.9 F (36.6 C)     Temp Source 07/06/20 1541 Oral     SpO2 07/06/20 1541 100 %     Weight 07/06/20 1542 90 lb (40.8 kg)     Height 07/06/20 1542 5\' 1"  (1.549 m)     Head Circumference --      Peak Flow --      Pain Score --      Pain Loc --      Pain Edu? --      Excl. in [place]? --     Constitutional: Alert and oriented to person, but not place or time.  Thin and cachectic. Eyes: Conjunctivae are normal. Head: Atraumatic. Nose: No congestion/rhinnorhea. Mouth/Throat: Mucous membranes are dry. Neck: Normal ROM Cardiovascular: Normal rate, regular rhythm. Grossly normal heart sounds. Respiratory: Normal respiratory effort.  No retractions. Lungs CTAB. Gastrointestinal: Soft and nontender. No distention. Genitourinary: deferred Musculoskeletal: No lower extremity tenderness nor edema. Neurologic:  Normal speech and language. No gross  focal neurologic deficits are appreciated. Skin:  Skin is warm, dry and intact. No rash noted. Psychiatric: Mood and affect are normal. Speech and behavior are normal.  ____________________________________________   LABS (all labs ordered are listed, but only abnormal results are displayed)  Labs Reviewed  BASIC METABOLIC PANEL - Abnormal; Notable for the following components:      Result Value   Sodium 132 (*)    Chloride 91 (*)    CO2 18 (*)    Glucose, Bld 537 (*)    BUN 35 (*)    Creatinine, Ser 1.07 (*)    GFR, Estimated 55 (*)    Anion gap 23 (*)    All other components within normal limits  CBC - Abnormal;  Notable for the following components:   Hemoglobin 15.2 (*)    All other components within normal limits  URINALYSIS, COMPLETE (UACMP) WITH MICROSCOPIC - Abnormal; Notable for the following components:   Color, Urine YELLOW (*)    APPearance HAZY (*)    Glucose, UA >=500 (*)    Hgb urine dipstick SMALL (*)    Ketones, ur 80 (*)    Leukocytes,Ua TRACE (*)    All other components within normal limits  CBG MONITORING, ED - Abnormal; Notable for the following components:   Glucose-Capillary 506 (*)    All other components within normal limits  RESP PANEL BY RT-PCR (FLU A&B, COVID) ARPGX2  URINE CULTURE  BETA-HYDROXYBUTYRIC ACID  MAGNESIUM  BASIC METABOLIC PANEL  BASIC METABOLIC PANEL  BASIC METABOLIC PANEL  BASIC METABOLIC PANEL  BLOOD GAS, VENOUS  CBG MONITORING, ED  CBG MONITORING, ED   ____________________________________________  ED ECG REPORT I, Blake Divine, the attending physician, personally viewed and interpreted this ECG.   Date: 07/06/2020  EKG Time: 19:19  Rate: 72  Rhythm: normal sinus rhythm  Axis: Normal  Intervals:none  ST&T Change: None   PROCEDURES  Procedure(s) performed (including Critical Care):  .Critical Care Performed by: Blake Divine, MD Authorized by: Blake Divine, MD   Critical care provider statement:    Critical care time (minutes):  45   Critical care time was exclusive of:  Separately billable procedures and treating other patients and teaching time   Critical care was necessary to treat or prevent imminent or life-threatening deterioration of the following conditions:  Endocrine crisis and metabolic crisis   Critical care was time spent personally by me on the following activities:  Discussions with consultants, evaluation of patient's response to treatment, examination of patient, ordering and performing treatments and interventions, ordering and review of laboratory studies, ordering and review of radiographic studies, pulse  oximetry, re-evaluation of patient's condition, obtaining history from patient or surrogate and review of old charts   I assumed direction of critical care for this patient from another provider in my specialty: no       ____________________________________________   INITIAL IMPRESSION / ASSESSMENT AND PLAN / ED COURSE       74 year old female with past medical history of hypertension, hyperlipidemia, diabetes, dementia who presents to the ED for increasing confusion and hyperglycemia for the past few days.  Patient is awake and alert on my evaluation with no focal deficits, although she is only oriented to person.  Labs confirm hyperglycemia with glucose greater than 500 and as well as increased anion gap and acidosis consistent with DKA.  We will check VBG and beta hydroxybutyrate, patient has received 1 L IV fluid bolus and we will give additional fluids.  Plan  to check chest x-ray and UA for any infectious process, also start patient on insulin drip.  UA is borderline for infection, we will send for culture but hold off on treatment given patient with no urinary symptoms or white count.  Chest x-ray reviewed by me and shows no infiltrate, edema, or effusion.  We will start patient on insulin drip and case discussed with hospitalist for admission.      ____________________________________________   FINAL CLINICAL IMPRESSION(S) / ED DIAGNOSES  Final diagnoses:  Diabetic ketoacidosis without coma associated with type 2 diabetes mellitus ([place])  Altered mental status, unspecified altered mental status type     ED Discharge Orders    None       Note:  This document was prepared using Dragon voice recognition software and may include unintentional dictation errors.   Blake Divine, MD 07/06/20 Junious Dresser    Blake Divine, MD 07/06/20 Karl Bales

## 2020-07-06 NOTE — ED Triage Notes (Signed)
Pt comes into the ED via POV and was sent by her PCP because her CBG was over 500.  Pt does not take insulin nor does she check her sugar on a daily basis.  Per her family friend the patient has been more lethargic and decreased in activity.  She has dementia at baseline so no "extreme" confusion has been noted.  Denies any N/V, but has had increased thirst.  Pt has even and unlabored respirations and was ambulatory to triage.

## 2020-07-06 NOTE — H&P (Signed)
History and Physical    Deborah Thornton VVO:[id number] DOB: 12-26-1945 DOA: 07/06/2020  PCP: Pcp, No  Patient coming from: Home  I have personally briefly reviewed patient's old medical records in [place]  Chief Complaint: Hyperglycemia  HPI: Deborah Thornton is a 74 y.o. female with medical history significant for Alzheimer's dementia, type 2 diabetes, hypertension, hyperlipidemia, and hypothyroidism who presents to the ED for evaluation of hyperglycemia.  History is limited from patient due to dementia and is otherwise obtained from [place], chart review, and patient's ex-husband at bedside.  Patient currently lives with her ex-husband who helps care for her.  He says over the last 2 months she has had notable weight loss and has not been eating well.  He says her blood sugars have been running high in the 400s.  She is currently on Metformin 3 times a day.  She was previously on Januvia and Actos but those were discontinued about 2 weeks ago due to poor appetite and weight loss.  Patient had blood work obtained by her PCP earlier today and was found to have hyperglycemia with serum glucose of 529.  She was advised to present to the ED for further evaluation and management.  ED Course:  Initial vitals showed BP 118/64, pulse 85, RR 20, temp 97.9 F, SPO2 100% on room air.  Show serum glucose 537, sodium 132 (142 when corrected for hyperglycemia), potassium 5.1, chloride 91, bicarb 18, BUN 35, creatinine 1.07, anion gap 23, WBC 8.9, hemoglobin 15.2, platelets 189,000.  Urinalysis showed >500 glucose, 80 ketones, negative nitrites, trace leukocytes, 0-5 RBC/hpf, 11-20 WBC/hpf, no bacteria microscopy.  SARS-CoV-2 PCR panel was obtained and pending.  2 view chest x-ray personally reviewed and negative for focal consolidation, edema, or effusion.  Patient was given 1 L NS, 1 L LR, and started on insulin infusion.  The hospitalist service was consulted to admit for further evaluation and  management.  Review of Systems:  Unable to obtain full review of systems due to dementia.   Past Medical History:  Diagnosis Date  . Dementia ([place])   . Diabetes mellitus without complication [facility])     Past Surgical History:  Procedure Laterality Date  . TONSILLECTOMY AND ADENOIDECTOMY  1954    Social History:  reports that she has never smoked. She has never used smokeless tobacco. No history on file for alcohol use and drug use.  No Known Allergies  Family History  Problem Relation Age of Onset  . Cancer Mother      Prior to Admission medications   Medication Sig Start Date End Date Taking? Authorizing Provider  DILT-XR 120 MG 24 hr capsule Take 120 mg by mouth daily. 12/11/16   [provider]  lisinopril (PRINIVIL,ZESTRIL) 10 MG tablet Take 10 mg by mouth 2 (two) times daily. 12/11/16   [provider]  metFORMIN (GLUCOPHAGE) 500 MG tablet Take 500 mg by mouth daily with breakfast.    [provider]  simvastatin (ZOCOR) 40 MG tablet Take 40 mg by mouth daily.    [provider]    Physical Exam: Vitals:   07/06/20 1715 07/06/20 1730 07/06/20 1830 07/06/20 1845  BP: 130/62 128/70    Pulse: 74 73 72 74  Resp: 18     Temp:      TempSrc:      SpO2: 100% 100% 100% 100%  Weight:      Height:       Constitutional: Thin elderly woman resting in  bed with head elevated, NAD, calm, comfortable Eyes: PERRL, lids and conjunctivae normal ENMT: Mucous membranes are dry. Posterior pharynx clear of any exudate or lesions.Normal dentition.  Neck: normal, supple, no masses. Respiratory: clear to auscultation bilaterally, no wheezing, no crackles. Normal respiratory effort. No accessory muscle use.  Cardiovascular: Regular rate and rhythm, no murmurs / rubs / gallops. No extremity edema. 2+ pedal pulses. Abdomen: no tenderness, no masses palpated. No hepatosplenomegaly. Bowel sounds positive.  Musculoskeletal: no clubbing / cyanosis. No joint  deformity upper and lower extremities. Good ROM, no contractures. Normal muscle tone.  Skin: no rashes, lesions, ulcers. No induration Neurologic: CN 2-12 grossly intact. Sensation intact, Strength 5/5 in all 4.  Psychiatric: Awake, alert, and oriented to self but not to place or year.  Recognizes ex-husband at bedside.  Pleasant mood.    Labs on Admission: I have personally reviewed following labs and imaging studies  CBC: Recent Labs  Lab 07/06/20 1544  WBC 8.9  HGB 15.2*  HCT 43.9  MCV 94.0  PLT 536   Basic Metabolic Panel: Recent Labs  Lab 07/06/20 1544  NA 132*  K 5.1  CL 91*  CO2 18*  GLUCOSE 537*  BUN 35*  CREATININE 1.07*  CALCIUM 10.0   GFR: Estimated Creatinine Clearance: 29.7 mL/min (A) (by C-G formula based on SCr of 1.07 mg/dL (H)). Liver Function Tests: No results for input(s): AST, ALT, ALKPHOS, BILITOT, PROT, ALBUMIN in the last 168 hours. No results for input(s): LIPASE, AMYLASE in the last 168 hours. No results for input(s): AMMONIA in the last 168 hours. Coagulation Profile: No results for input(s): INR, PROTIME in the last 168 hours. Cardiac Enzymes: No results for input(s): CKTOTAL, CKMB, CKMBINDEX, TROPONINI in the last 168 hours. BNP (last 3 results) No results for input(s): PROBNP in the last 8760 hours. HbA1C: No results for input(s): HGBA1C in the last 72 hours. CBG: Recent Labs  Lab 07/06/20 1547  GLUCAP 506*   Lipid Profile: No results for input(s): CHOL, HDL, LDLCALC, TRIG, CHOLHDL, LDLDIRECT in the last 72 hours. Thyroid Function Tests: No results for input(s): TSH, T4TOTAL, FREET4, T3FREE, THYROIDAB in the last 72 hours. Anemia Panel: No results for input(s): VITAMINB12, FOLATE, FERRITIN, TIBC, IRON, RETICCTPCT in the last 72 hours. Urine analysis:    Component Value Date/Time   COLORURINE YELLOW (A) 07/06/2020 1752   APPEARANCEUR HAZY (A) 07/06/2020 1752   LABSPEC 1.027 07/06/2020 1752   PHURINE 5.0 07/06/2020 1752    GLUCOSEU >=500 (A) 07/06/2020 1752   HGBUR SMALL (A) 07/06/2020 1752   BILIRUBINUR NEGATIVE 07/06/2020 1752   KETONESUR 80 (A) 07/06/2020 1752   PROTEINUR NEGATIVE 07/06/2020 1752   NITRITE NEGATIVE 07/06/2020 1752   LEUKOCYTESUR TRACE (A) 07/06/2020 1752    Radiological Exams on Admission: No results found.  EKG: Personally reviewed. Sinus rhythm, motion artifact.  Tachycardia resolved when compared to previous EKG.  Assessment/Plan Principal Problem:   Diabetic ketoacidosis associated with type 2 diabetes mellitus ([place]) Active Problems:   Alzheimer's dementia without behavioral disturbance ([place])   Hyperlipidemia associated with type 2 diabetes mellitus ([place])   Hypertension associated with diabetes ([place])   Hypothyroidism  Deborah Thornton is a 74 y.o. female with medical history significant for Alzheimer's dementia, type 2 diabetes, hypertension, hyperlipidemia, and hypothyroidism who is admitted with DKA.  DKA associated with type 2 diabetes: -Continue insulin infusion per protocol -Continue IVF as ordered and change to D5 in LR when CBG <250 -Serial BMP, transition to subcutaneous insulin when  gap closed -Hold Metformin currently on hold -Last A1c 11.9% 06/26/2020  Hypertension: Currently normotensive.  Home diltiazem and lisinopril on hold.  Hyperlipidemia: Continue simvastatin  Hypothyroidism: Continue Synthroid.  Alzheimer's dementia: Continue Namenda and Aricept.  DVT prophylaxis: Lovenox Code Status: DNR, confirmed with patient's caregiver at bedside Family Communication: Discussed with patient's caregiver/ex-husband at bedside Disposition Plan: From home and likely discharge to home pending resolution of DKA process Consults called: None Admission status:  Status is: Inpatient  Remains inpatient appropriate because:IV treatments appropriate due to intensity of illness or inability to take PO and Inpatient level of care appropriate due to severity of  illness   Dispo: The patient is from: Home              Anticipated d/c is to: Home              Anticipated d/c date is: 2 days              Patient currently is not medically stable to d/c.  Zada Finders MD [facility]  If 7PM-7AM, please contact night-coverage [email or web address]  07/06/2020, 7:01 PM

## 2020-07-06 NOTE — ED Notes (Signed)
Ex-Husband at bedside.

## 2020-07-06 NOTE — Progress Notes (Signed)
PHARMACIST - PHYSICIAN COMMUNICATION  CONCERNING:  Enoxaparin (Lovenox) for DVT Prophylaxis    RECOMMENDATION: Patient was prescribed enoxaprin 40mg  q24 hours for VTE prophylaxis.   Filed Weights   07/06/20 1542  Weight: 40.8 kg (90 lb)    Body mass index is 17.01 kg/m.  Estimated Creatinine Clearance: 37.4 mL/min (by C-G formula based on SCr of 0.85 mg/dL).  Patient is candidate for enoxaparin 30mg  every 24 hours based on Weight <45kg  DESCRIPTION: Pharmacy has adjusted enoxaparin dose per [facility] policy.  Patient is now receiving enoxaparin 30 mg every 24 hours    Karolyna Bianchini, PharmD Clinical Pharmacist  07/06/2020 7:37 PM

## 2020-07-06 NOTE — ED Notes (Signed)
Deborah Thornton ( ex-Husband) [phone]  Pt lives with Ex-husband

## 2020-07-06 NOTE — ED Notes (Signed)
Date and time results received: 07/06/20 5:10pm Test: Glucose  Critical Value: Glucose 537  Name of Provider Notified: MD Charna Archer

## 2020-07-07 LAB — CBG MONITORING, ED
Glucose-Capillary: 252 mg/dL — ABNORMAL HIGH (ref 70–99)
Glucose-Capillary: 220 mg/dL — ABNORMAL HIGH (ref 70–99)
Glucose-Capillary: 204 mg/dL — ABNORMAL HIGH (ref 70–99)
Glucose-Capillary: 175 mg/dL — ABNORMAL HIGH (ref 70–99)
Glucose-Capillary: 176 mg/dL — ABNORMAL HIGH (ref 70–99)
Glucose-Capillary: 130 mg/dL — ABNORMAL HIGH (ref 70–99)
Glucose-Capillary: 140 mg/dL — ABNORMAL HIGH (ref 70–99)
Glucose-Capillary: 133 mg/dL — ABNORMAL HIGH (ref 70–99)
Glucose-Capillary: 289 mg/dL — ABNORMAL HIGH (ref 70–99)
Glucose-Capillary: 116 mg/dL — ABNORMAL HIGH (ref 70–99)
Glucose-Capillary: 126 mg/dL — ABNORMAL HIGH (ref 70–99)
Glucose-Capillary: 103 mg/dL — ABNORMAL HIGH (ref 70–99)

## 2020-07-07 LAB — HEMOGLOBIN A1C
Hgb A1c MFr Bld: 11.8 % — ABNORMAL HIGH (ref 4.8–5.6)
Mean Plasma Glucose: 291.96 mg/dL

## 2020-07-07 LAB — BASIC METABOLIC PANEL
Anion gap: 8 (ref 5–15)
Anion gap: 9 (ref 5–15)
BUN: 23 mg/dL (ref 8–23)
BUN: 19 mg/dL (ref 8–23)
CO2: 24 mmol/L (ref 22–32)
CO2: 23 mmol/L (ref 22–32)
Calcium: 8.7 mg/dL — ABNORMAL LOW (ref 8.9–10.3)
Calcium: 8.4 mg/dL — ABNORMAL LOW (ref 8.9–10.3)
Chloride: 102 mmol/L (ref 98–111)
Chloride: 104 mmol/L (ref 98–111)
Creatinine, Ser: 0.62 mg/dL (ref 0.44–1.00)
Creatinine, Ser: 0.45 mg/dL (ref 0.44–1.00)
GFR, Estimated: >60 mL/min (ref >60)
GFR, Estimated: >60 mL/min (ref >60)
Glucose, Bld: 301 mg/dL — ABNORMAL HIGH (ref 70–99)
Glucose, Bld: 116 mg/dL — ABNORMAL HIGH (ref 70–99)
Potassium: 3.6 mmol/L (ref 3.5–5.1)
Potassium: 3.3 mmol/L — ABNORMAL LOW (ref 3.5–5.1)
Sodium: 134 mmol/L — ABNORMAL LOW (ref 135–145)
Sodium: 136 mmol/L (ref 135–145)

## 2020-07-07 LAB — BLOOD GAS, VENOUS
Acid-base deficit: 2.4 mmol/L — ABNORMAL HIGH (ref 0.0–2.0)
Bicarbonate: 23.7 mmol/L (ref 20.0–28.0)
O2 Saturation: 51.8 %
Patient temperature: 37
pCO2, Ven: 45 mmHg (ref 44.0–60.0)
pH, Ven: 7.33 (ref 7.250–7.430)
pO2, Ven: <31 mmHg — CL (ref 32.0–45.0)

## 2020-07-07 MED ORDER — INSULIN ASPART 100 UNIT/ML ~~LOC~~ SOLN: 0.0000 [IU] | Freq: Three times a day (TID) | SUBCUTANEOUS | Status: DC

## 2020-07-07 MED ORDER — INSULIN GLARGINE 100 UNIT/ML ~~LOC~~ SOLN
15.0000 [IU] | SUBCUTANEOUS | Status: DC
  Administered 2020-07-07: 15 [IU] via SUBCUTANEOUS
  Filled 2020-07-07 (×2): qty 0.15

## 2020-07-07 MED ORDER — INSULIN ASPART 100 UNIT/ML ~~LOC~~ SOLN: 0.0000 [IU] | Freq: Every day | SUBCUTANEOUS | Status: DC

## 2020-07-07 MED ORDER — LIVING WELL WITH DIABETES BOOK
Freq: Once | Status: DC
  Filled 2020-07-07: qty 1

## 2020-07-07 MED ORDER — INSULIN GLARGINE 100 UNIT/ML ~~LOC~~ SOLN: 10.0000 [IU] | Freq: Every day | SUBCUTANEOUS | Status: DC

## 2020-07-07 MED ORDER — INSULIN ASPART 100 UNIT/ML ~~LOC~~ SOLN: 3.0000 [IU] | Freq: Three times a day (TID) | SUBCUTANEOUS | Status: DC

## 2020-07-07 NOTE — ED Notes (Signed)
Pt is on cardiac, bp and pulse ox monitor. Pt on bed alarm. Pt unable to say why she is here. As per previous RN, pt alert and oriented to self only.

## 2020-07-07 NOTE — ED Notes (Signed)
Hospitalist secured messaged: "Deborah Thornton, this pt came in for DKA. Since being here she has not been on an insulin gtt or any IV fluids. Her last blood sugar was 103. At 8pm I did blood work and her gap was 9. Would you like here on any IV fluids?"

## 2020-07-07 NOTE — Progress Notes (Addendum)
Inpatient Diabetes Program Recommendations  AACE/ADA: New Consensus Statement on Inpatient Glycemic Control (2015)  Target Ranges:  Prepandial:   less than 140 mg/dL      Peak postprandial:   less than 180 mg/dL (1-2 hours)      Critically ill patients:  140 - 180 mg/dL   Lab Results  Component Value Date   GLUCAP 133 (H) 07/07/2020    Review of Glycemic Control Results for Deborah Thornton, Deborah Thornton (MRN [id number]) as of 07/07/2020 09:34  Ref. Range 07/07/2020 05:23 07/07/2020 06:19 07/07/2020 07:31 07/07/2020 08:38 07/07/2020 09:30  Glucose-Capillary Latest Ref Range: 70 - 99 mg/dL 175 (H) 176 (H) 130 (H) 140 (H) 133 (H)   Diabetes history: DM 2- Last A1C=11.9 Outpatient Diabetes medications:  Metformin 500 mg tid  Current orders for Inpatient glycemic control:  Novolog sensitive tid with meals and HS Novolog 3 units tid with meals Lantus 15 units daily  Inpatient Diabetes Program Recommendations:   Referral received.  Agree with current orders.  Patient will likely need insulin at discharge.  Will contact caregiver regarding insulin teaching.   Thanks  Adah Perl, RN, BC-ADM Inpatient Diabetes Coordinator Pager [phone] (8a-5p)  Addendum:  Called and spoke with patient's ex-husband Deborah Thornton.  He states that patient lives with him. Explained that patient will likely need insulin at discharge. He states that he thinks Dr. Lovie Macadamia has already called insulin in, however I don't see that in the notes.   Discussed use of insulin pen and sent Deborah insulin pen video so he could watch and review.  We also discussed signs, symptoms of both high and low blood sugars and how to treat.  He has meter at home and knows normal blood sugar levels.  Will need to practice injecting insulin to patient when he comes to visit.  Discussed A1C level as well.  He was very attentive and thankful for information.    Thanks,  Adah Perl, RN, BC-ADM Inpatient Diabetes Coordinator Pager  [phone] (8a-5p)

## 2020-07-07 NOTE — Progress Notes (Signed)
PROGRESS NOTE    Deborah Thornton  WFU:[id number] DOB: Nov 17, 1945 DOA: 07/06/2020 PCP: Pcp, No   Brief Narrative:  HPI: Deborah Thornton is a 74 y.o. female with medical history significant for Alzheimer's dementia, type 2 diabetes, hypertension, hyperlipidemia, and hypothyroidism who presents to the ED for evaluation of hyperglycemia.  History is limited from patient due to dementia and is otherwise obtained from [place], chart review, and patient's ex-husband at bedside.  Patient currently lives with her ex-husband who helps care for her.  He says over the last 2 months she has had notable weight loss and has not been eating well.  He says her blood sugars have been running high in the 400s.  She is currently on Metformin 3 times a day.  She was previously on Januvia and Actos but those were discontinued about 2 weeks ago due to poor appetite and weight loss.  Patient had blood work obtained by her PCP earlier today and was found to have hyperglycemia with serum glucose of 529.  She was advised to present to the ED for further evaluation and management.  ED Course:  Initial vitals showed BP 118/64, pulse 85, RR 20, temp 97.9 F, SPO2 100% on room air.  Show serum glucose 537, sodium 132 (142 when corrected for hyperglycemia), potassium 5.1, chloride 91, bicarb 18, BUN 35, creatinine 1.07, anion gap 23, WBC 8.9, hemoglobin 15.2, platelets 189,000.  Urinalysis showed >500 glucose, 80 ketones, negative nitrites, trace leukocytes, 0-5 RBC/hpf, 11-20 WBC/hpf, no bacteria microscopy.  SARS-CoV-2 PCR panel was obtained and pending.  2 view chest x-ray personally reviewed and negative for focal consolidation, edema, or effusion.  Patient was given 1 L NS, 1 L LR, and started on insulin infusion.  The hospitalist service was consulted to admit for further evaluation and management.  Assessment & Plan:   Principal Problem:   Diabetic ketoacidosis associated with type 2 diabetes mellitus  ([place]) Active Problems:   Alzheimer's dementia without behavioral disturbance ([place])   Hyperlipidemia associated with type 2 diabetes mellitus ([place])   Hypertension associated with diabetes ([place])   Hypothyroidism   DKA associated with type 2 diabetes: Patient's anion gap has closed, DKA has resolved.  Patient received Lantus this morning and was transitioned off of insulin drip and dextrose drip and now she is on SSI.  Blood sugar controlled now  Hypertension: Blood pressure on the lower side.  Continue to hold diltiazem and lisinopril.  Hyperlipidemia: Continue simvastatin  Hypothyroidism: Continue Synthroid.  Alzheimer's dementia: Continue Namenda and Aricept.  DVT prophylaxis: enoxaparin (LOVENOX) injection 30 mg Start: 07/06/20 2000   Code Status: DNR  Family Communication: Her ex-husband who is the sole caretaker present at bedside.  Plan of care discussed with him as patient has advanced dementia.  Status is: Inpatient  Remains inpatient appropriate because:Inpatient level of care appropriate due to severity of illness   Dispo: The patient is from: Home              Anticipated d/c is to: Home              Anticipated d/c date is: 1 day              Patient currently is not medically stable to d/c.        Estimated body mass index is 17.01 kg/m as calculated from the following:   Height as of this encounter: 5\' 1"  (1.549 m).   Weight as of this encounter: 40.8 kg.  Nutritional status:               Consultants:   None  Procedures:   None  Antimicrobials:  Anti-infectives (From admission, onward)   None         Subjective: Patient seen and examined this morning.  Her ex husband was at the bedside.  Patient was alert and oriented to person only.  Per ex-husband, this is her baseline due to advanced dementia.  Patient had no complaint.  Objective: Vitals:   07/07/20 0730 07/07/20 0830 07/07/20 1000 07/07/20 1100  BP: (!) 95/57  (!) 99/59 (!) 104/57 (!) 102/59  Pulse: 68 (!) 59 (!) 59 63  Resp: 17 10 13 11   Temp:      TempSrc:      SpO2: 98% 99% 99% 100%  Weight:      Height:        Intake/Output Summary (Last 24 hours) at 07/07/2020 1430 Last data filed at 07/06/2020 1718 Gross per 24 hour  Intake 1000 ml  Output --  Net 1000 ml   Filed Weights   07/06/20 1542  Weight: 40.8 kg    Examination:  General exam: Appears calm and comfortable  Respiratory system: Clear to auscultation. Respiratory effort normal. Cardiovascular system: S1 & S2 heard, RRR. No JVD, murmurs, rubs, gallops or clicks. No pedal edema. Gastrointestinal system: Abdomen is nondistended, soft and nontender. No organomegaly or masses felt. Normal bowel sounds heard. Central nervous system: Alert and oriented x1. No focal neurological deficits. Extremities: Symmetric 5 x 5 power. Skin: No rashes, lesions or ulcers  Data Reviewed: I have personally reviewed following labs and imaging studies  CBC: Recent Labs  Lab 07/06/20 1544  WBC 8.9  HGB 15.2*  HCT 43.9  MCV 94.0  PLT 989   Basic Metabolic Panel: Recent Labs  Lab 07/06/20 1544 07/06/20 1846 07/06/20 2256 07/07/20 0415  NA 132* 135 136 134*  K 5.1 4.7 3.9 3.6  CL 91* 97* 103 102  CO2 18* 18* 20* 24  GLUCOSE 537* 456* 272* 301*  BUN 35* 30* 23 23  CREATININE 1.07* 0.85 0.59 0.62  CALCIUM 10.0 9.0 8.3* 8.7*  MG  --  1.9  --   --    GFR: Estimated Creatinine Clearance: 39.7 mL/min (by C-G formula based on SCr of 0.62 mg/dL). Liver Function Tests: No results for input(s): AST, ALT, ALKPHOS, BILITOT, PROT, ALBUMIN in the last 168 hours. No results for input(s): LIPASE, AMYLASE in the last 168 hours. No results for input(s): AMMONIA in the last 168 hours. Coagulation Profile: No results for input(s): INR, PROTIME in the last 168 hours. Cardiac Enzymes: No results for input(s): CKTOTAL, CKMB, CKMBINDEX, TROPONINI in the last 168 hours. BNP (last 3 results) No  results for input(s): PROBNP in the last 8760 hours. HbA1C: No results for input(s): HGBA1C in the last 72 hours. CBG: Recent Labs  Lab 07/07/20 0523 07/07/20 0619 07/07/20 0731 07/07/20 0838 07/07/20 0930  GLUCAP 175* 176* 130* 140* 133*   Lipid Profile: No results for input(s): CHOL, HDL, LDLCALC, TRIG, CHOLHDL, LDLDIRECT in the last 72 hours. Thyroid Function Tests: No results for input(s): TSH, T4TOTAL, FREET4, T3FREE, THYROIDAB in the last 72 hours. Anemia Panel: No results for input(s): VITAMINB12, FOLATE, FERRITIN, TIBC, IRON, RETICCTPCT in the last 72 hours. Sepsis Labs: No results for input(s): PROCALCITON, LATICACIDVEN in the last 168 hours.  Recent Results (from the past 240 hour(s))  Resp Panel by RT-PCR (Flu A&B, Covid) Nasopharyngeal Swab  Status: None   Collection Time: 07/06/20  6:46 PM   Specimen: Nasopharyngeal Swab; Nasopharyngeal(NP) swabs in vial transport medium  Result Value Ref Range Status   SARS Coronavirus 2 by RT PCR NEGATIVE NEGATIVE Final    Comment: (NOTE) SARS-CoV-2 target nucleic acids are NOT DETECTED.  The SARS-CoV-2 RNA is generally detectable in upper respiratory specimens during the acute phase of infection. The lowest concentration of SARS-CoV-2 viral copies this assay can detect is 138 copies/mL. A negative result does not preclude SARS-Cov-2 infection and should not be used as the sole basis for treatment or other patient management decisions. A negative result may occur with  improper specimen collection/handling, submission of specimen other than nasopharyngeal swab, presence of viral mutation(s) within the areas targeted by this assay, and inadequate number of viral copies(<138 copies/mL). A negative result must be combined with clinical observations, patient history, and epidemiological information. The expected result is Negative.  Fact Sheet for Patients:  EntrepreneurPulse.com.au  Fact Sheet for  Healthcare Providers:  IncredibleEmployment.be  This test is no t yet approved or cleared by the [place] FDA and  has been authorized for detection and/or diagnosis of SARS-CoV-2 by FDA under an Emergency Use Authorization (EUA). This EUA will remain  in effect (meaning this test can be used) for the duration of the COVID-19 declaration under Section 564(b)(1) of the Act, 21 U.S.C.section 360bbb-3(b)(1), unless the authorization is terminated  or revoked sooner.       Influenza A by PCR NEGATIVE NEGATIVE Final   Influenza B by PCR NEGATIVE NEGATIVE Final    Comment: (NOTE) The Xpert Xpress SARS-CoV-2/FLU/RSV plus assay is intended as an aid in the diagnosis of influenza from Nasopharyngeal swab specimens and should not be used as a sole basis for treatment. Nasal washings and aspirates are unacceptable for Xpert Xpress SARS-CoV-2/FLU/RSV testing.  Fact Sheet for Patients: EntrepreneurPulse.com.au  Fact Sheet for Healthcare Providers: IncredibleEmployment.be  This test is not yet approved or cleared by the [place] FDA and has been authorized for detection and/or diagnosis of SARS-CoV-2 by FDA under an Emergency Use Authorization (EUA). This EUA will remain in effect (meaning this test can be used) for the duration of the COVID-19 declaration under Section 564(b)(1) of the Act, 21 U.S.C. section 360bbb-3(b)(1), unless the authorization is terminated or revoked.  Performed at [facility], [address] [place], [place] [address]       Radiology Studies: DG Chest 2 View  Result Date: 07/06/2020 CLINICAL DATA:  Altered mental status.  Hyperglycemia. EXAM: CHEST - 2 VIEW COMPARISON:  08/10/2007 FINDINGS: Atherosclerotic calcification of the aortic arch. The lungs appear clear. Cardiac and mediastinal margins appear normal. No blunting of the costophrenic angles. No significant bony abnormality  observed. IMPRESSION: 1. No active cardiopulmonary disease is radiographically apparent. 2. Atherosclerosis. Electronically Signed   By: Van Clines M.D.   On: 07/06/2020 18:59    Scheduled Meds: . donepezil  10 mg Oral Daily  . enoxaparin (LOVENOX) injection  30 mg Subcutaneous Q24H  . insulin aspart  0-5 Units Subcutaneous QHS  . insulin aspart  0-9 Units Subcutaneous TID WC  . insulin aspart  3 Units Subcutaneous TID WC  . insulin glargine  15 Units Subcutaneous Q24H  . levothyroxine  25 mcg Oral Q0600  . memantine  10 mg Oral BID  . simvastatin  40 mg Oral Daily   Continuous Infusions: . dextrose 5% lactated ringers 100 mL/hr at 07/06/20 2305  . insulin 4 Units/hr (07/07/20  9458)  . lactated ringers Stopped (07/06/20 2306)     LOS: 1 day   Time spent: 33 minutes   Darliss Cheney, MD [facility]  07/07/2020, 2:30 PM   To contact the attending provider between 7A-7P or the covering provider during after hours 7P-7A, please log into the web site [email or web address].

## 2020-07-08 LAB — URINE CULTURE

## 2020-07-08 LAB — CBG MONITORING, ED: Glucose-Capillary: 95 mg/dL (ref 70–99)

## 2020-07-08 LAB — BASIC METABOLIC PANEL
Anion gap: 7 (ref 5–15)
BUN: 17 mg/dL (ref 8–23)
CO2: 25 mmol/L (ref 22–32)
Calcium: 8.7 mg/dL — ABNORMAL LOW (ref 8.9–10.3)
Chloride: 103 mmol/L (ref 98–111)
Creatinine, Ser: 0.53 mg/dL (ref 0.44–1.00)
GFR, Estimated: >60 mL/min (ref >60)
Glucose, Bld: 101 mg/dL — ABNORMAL HIGH (ref 70–99)
Potassium: 3.3 mmol/L — ABNORMAL LOW (ref 3.5–5.1)
Sodium: 135 mmol/L (ref 135–145)

## 2020-07-08 MED ORDER — POTASSIUM CHLORIDE CRYS ER 20 MEQ PO TBCR: 40.0000 meq | EXTENDED_RELEASE_TABLET | Freq: Once | ORAL | Status: DC

## 2020-07-08 MED ORDER — INSULIN GLARGINE 100 UNIT/ML ~~LOC~~ SOLN: 15.0000 [IU] | SUBCUTANEOUS | 0 refills | Status: DC

## 2020-07-08 MED ORDER — INSULIN ASPART 100 UNIT/ML ~~LOC~~ SOLN: 3.0000 [IU] | Freq: Three times a day (TID) | SUBCUTANEOUS | 0 refills | Status: DC

## 2020-07-08 NOTE — Discharge Instructions (Signed)
Diabetic Ketoacidosis °Diabetic ketoacidosis is a serious complication of diabetes. This condition develops when there is not enough insulin in the body. Insulin is an hormone that regulates blood sugar levels in the body. Normally, insulin allows glucose to enter the cells in the body. The cells break down glucose for energy. Without enough insulin, the body cannot break down glucose, so it breaks down fats instead. This leads to high blood glucose levels in the body and the production of acids that are called ketones. Ketones are poisonous at high levels. °If diabetic ketoacidosis is not treated, it can cause severe dehydration and can lead to a coma or death. °What are the causes? °This condition develops when a lack of insulin causes the body to break down fats instead of glucose. This may be triggered by: °· Stress on the body. This stress is brought on by an illness. °· Infection. °· Medicines that raise blood glucose levels. °· Not taking diabetes medicine. °· New onset of type 1 diabetes mellitus. °What are the signs or symptoms? °Symptoms of this condition include: °· Fatigue. °· Weight loss. °· Excessive thirst. °· Light-headedness. °· Fruity or sweet-smelling breath. °· Excessive urination. °· Vision changes. °· Confusion or irritability. °· Nausea. °· Vomiting. °· Rapid breathing. °· Abdominal pain. °· Feeling flushed. °How is this diagnosed? °This condition is diagnosed based on your medical history, a physical exam, and blood tests. You may also have a urine test to check for ketones. °How is this treated? °This condition may be treated with: °· Fluid replacement. This may be done to correct dehydration. °· Insulin injections. These may be given through the skin or through an IV tube. °· Electrolyte replacement. Electrolytes are minerals in your blood. Electrolytes such as potassium and sodium may be given in pill form or through an IV tube. °· Antibiotic medicines. These may be prescribed if your  condition was caused by an infection. °Diabetic ketoacidosis is a serious medical condition. You may need emergency treatment in the hospital to monitor your condition. °Follow these instructions at home: °Eating and drinking °· Drink enough fluids to keep your urine clear or pale yellow. °· If you are not able to eat, drink clear fluids in small amounts as you are able. Clear fluids include water, ice chips, fruit juice with water added (diluted), and low-calorie sports drinks. You may also have sugar-free jello or popsicles. °· If you are able to eat, follow your usual diet and drink sugar-free liquids, such as water. °Medicines °· Take over-the-counter and prescription medicines only as told by your health care provider. °· Continue to take insulin and other diabetes medicines as told by your health care provider. °· If you were prescribed an antibiotic, take it as told by your health care provider. Do not stop taking the antibiotic even if you start to feel better. °General instructions ° °· Check your urine for ketones when you are ill and as told by your health care provider. °? If your blood glucose is 240 mg/dL (13.3 mmol/L) or higher, check your urine ketones every 4-6 hours. °· Check your blood glucose every day, as often as told by your health care provider. °? If your blood glucose is high, drink plenty of fluids. This helps to flush out ketones. °? If your blood glucose is above your target for 2 tests in a row, contact your health care provider. °· Carry a medical alert card or wear medical alert jewelry that says that you have diabetes. °· Rest   and exercise only as told by your health care provider. Do not exercise when your blood glucose is high and you have ketones in your urine. °· If you get sick, call your health care provider and begin treatment quickly. Your body often needs extra insulin to fight an illness. Check your blood glucose every 4-6 hours when you are sick. °· Keep all follow-up  visits as told by your health care provider. This is important. °Contact a health care provider if: °· Your blood glucose level is higher than 240 mg/dL (13.3 mmol/L) for 2 days in a row. °· You have moderate or large ketones in your urine. °· You have a fever. °· You cannot eat or drink without vomiting. °· You have been vomiting for more than 2 hours. °· You continue to have symptoms of diabetic ketoacidosis. °· You develop new symptoms. °Get help right away if: °· Your blood glucose monitor reads “high” even when you are taking insulin. °· You faint. °· You have chest pain. °· You have trouble breathing. °· You have sudden trouble speaking or swallowing. °· You have vomiting or diarrhea that gets worse after 3 hours. °· You are unable to stay awake. °· You have trouble thinking. °· You are severely dehydrated. Symptoms of severe dehydration include: °? Extreme thirst. °? Dry mouth. °? Rapid breathing. °These symptoms may represent a serious problem that is an emergency. Do not wait to see if the symptoms will go away. Get medical help right away. Call your local emergency services (911 in the [place] Do not drive yourself to the hospital. °Summary °· Diabetic ketoacidosis is a serious complication of diabetes. This condition develops when there is not enough insulin in the body. °· This condition is diagnosed based on your medical history, a physical exam, and blood tests. You may also have a urine test to check for ketones. °· Diabetic ketoacidosis is a serious medical condition. You may need emergency treatment in the hospital to monitor your condition. °· Contact your health care provider if your blood glucose is higher than 240 mg/dl for 2 days in a row or if you have moderate or large ketones in your urine. °This information is not intended to replace advice given to you by your health care provider. Make sure you discuss any questions you have with your health care provider. °Document Revised: 08/30/2016  Document Reviewed: 08/19/2016 °Elsevier Patient Education © 2020 [employer] ° °

## 2020-07-08 NOTE — Discharge Summary (Signed)
Physician Discharge Summary  Deborah Thornton XQJ:[id number] DOB: 01/16/1946 DOA: 07/06/2020  PCP: Pcp, No  Admit date: 07/06/2020 Discharge date: 07/08/2020  Admitted From: Home Disposition: Home  Recommendations for Outpatient Follow-up:  1. Follow up with PCP in 1-2 weeks 2. Please obtain BMP/CBC in one week 3. Please follow up with your PCP on the following pending results: Unresulted Labs (From admission, onward)          Start     Ordered   07/06/20 1823  Urine culture  Add-on,   AD        07/06/20 [place]: None Equipment/Devices: None  Discharge Condition: Stable CODE STATUS: DNR Diet recommendation: Diabetic  Subjective: Seen and examined.  Alert and oriented x1, at her baseline.  Husband at the bedside.  No complaints.  Brief/Interim Summary: Deborah Thornton a 74 y.o.femalewith medical history significant forAlzheimer's dementia, type 2 diabetes, hypertension, hyperlipidemia, and hypothyroidism who presented to the ED for evaluation of hyperglycemia.  Patient currently lives with her ex-husband who helps care for her. He says over the last 2 months she has had notable weight loss and has not been eating well. He says her blood sugars have been running high in the 400s. She is currently on Metformin 3 times a day. She was previously on Januvia and Actos but those were discontinued about 2 weeks ago by her husband without seeking medical advice from the physician due to poor appetite and weight loss. Patient had blood work obtained by her PCP earlier on the day of admission and was found to have hyperglycemia with serum glucose of 529. She was advised to present to the ED for further evaluation and management.  Upon arrival to ED, she was diagnosed with DKA.  She was admitted to hospital service.  She was started on IV fluids and insulin drip and was treated per protocol.  Within 24 hours, her DKA resolved.  She was switched to long-acting  insulin and sliding's insulin as well as Premeal regimen.  She was seen by diabetes coordinator as well.  Husband was educated on how to inject Lantus and premeal regimen with her.  Her blood sugar remained under control.  She is stable and is being discharged today.  Discharge Diagnoses:  Principal Problem:   Diabetic ketoacidosis associated with type 2 diabetes mellitus ([place]) Active Problems:   Alzheimer's dementia without behavioral disturbance ([employer])   Hyperlipidemia associated with type 2 diabetes mellitus ([place])   Hypertension associated with diabetes ([place])   Hypothyroidism    Discharge Instructions   Allergies as of 07/08/2020      Reactions   Penicillin G Other (See Comments)   Rofecoxib Other (See Comments)   Sulfa Antibiotics Other (See Comments)      Medication List    TAKE these medications   aspirin EC 81 MG tablet Take 81 mg by mouth daily. Swallow whole.   cholecalciferol 25 MCG (1000 UNIT) tablet Commonly known as: VITAMIN D Take 2,000 Units by mouth daily.   diltiazem 120 MG 24 hr capsule Commonly known as: TIAZAC Take 120 mg by mouth daily.   donepezil 10 MG tablet Commonly known as: ARICEPT Take 10 mg by mouth daily.   insulin aspart 100 UNIT/ML injection Commonly known as: novoLOG Inject 3 Units into the skin 3 (three) times daily with meals.   insulin glargine 100 UNIT/ML injection Commonly known as: LANTUS Inject 0.15 mLs (15 Units total)  into the skin daily. Start taking on: July 09, 2020   levothyroxine 25 MCG tablet Commonly known as: SYNTHROID Take 25 mcg by mouth daily before breakfast.   lisinopril 10 MG tablet Commonly known as: ZESTRIL Take 10 mg by mouth in the morning and at bedtime.   metFORMIN 500 MG tablet Commonly known as: GLUCOPHAGE Take 500 mg by mouth 3 (three) times daily.   multivitamin with minerals tablet Take 1 tablet by mouth daily.   simvastatin 40 MG tablet Commonly known as: ZOCOR Take 40 mg by mouth  daily.       Allergies  Allergen Reactions  . Penicillin G Other (See Comments)  . Rofecoxib Other (See Comments)  . Sulfa Antibiotics Other (See Comments)    Consultations: None   Procedures/Studies: DG Chest 2 View  Result Date: 07/06/2020 CLINICAL DATA:  Altered mental status.  Hyperglycemia. EXAM: CHEST - 2 VIEW COMPARISON:  08/10/2007 FINDINGS: Atherosclerotic calcification of the aortic arch. The lungs appear clear. Cardiac and mediastinal margins appear normal. No blunting of the costophrenic angles. No significant bony abnormality observed. IMPRESSION: 1. No active cardiopulmonary disease is radiographically apparent. 2. Atherosclerosis. Electronically Signed   By: Van Clines M.D.   On: 07/06/2020 18:59      Discharge Exam: Vitals:   07/08/20 0634 07/08/20 0800  BP: 118/67 110/67  Pulse: (!) 58 64  Resp: 12 16  Temp:    SpO2: 99% 100%   Vitals:   07/08/20 0600 07/08/20 0626 07/08/20 0634 07/08/20 0800  BP: 118/67  118/67 110/67  Pulse: 63 (!) 56 (!) 58 64  Resp:  12 12 16   Temp:      TempSrc:      SpO2: 98% 99% 99% 100%  Weight:      Height:        General: Pt is alert, awake, not in acute distress Cardiovascular: RRR, S1/S2 +, no rubs, no gallops Respiratory: CTA bilaterally, no wheezing, no rhonchi Abdominal: Soft, NT, ND, bowel sounds + Extremities: no edema, no cyanosis    The results of significant diagnostics from this hospitalization (including imaging, microbiology, ancillary and laboratory) are listed below for reference.     Microbiology: Recent Results (from the past 240 hour(s))  Resp Panel by RT-PCR (Flu A&B, Covid) Nasopharyngeal Swab     Status: None   Collection Time: 07/06/20  6:46 PM   Specimen: Nasopharyngeal Swab; Nasopharyngeal(NP) swabs in vial transport medium  Result Value Ref Range Status   SARS Coronavirus 2 by RT PCR NEGATIVE NEGATIVE Final    Comment: (NOTE) SARS-CoV-2 target nucleic acids are NOT  DETECTED.  The SARS-CoV-2 RNA is generally detectable in upper respiratory specimens during the acute phase of infection. The lowest concentration of SARS-CoV-2 viral copies this assay can detect is 138 copies/mL. A negative result does not preclude SARS-Cov-2 infection and should not be used as the sole basis for treatment or other patient management decisions. A negative result may occur with  improper specimen collection/handling, submission of specimen other than nasopharyngeal swab, presence of viral mutation(s) within the areas targeted by this assay, and inadequate number of viral copies(<138 copies/mL). A negative result must be combined with clinical observations, patient history, and epidemiological information. The expected result is Negative.  Fact Sheet for Patients:  EntrepreneurPulse.com.au  Fact Sheet for Healthcare Providers:  IncredibleEmployment.be  This test is no t yet approved or cleared by the [place] FDA and  has been authorized for detection and/or diagnosis of SARS-CoV-2 by FDA  under an Emergency Use Authorization (EUA). This EUA will remain  in effect (meaning this test can be used) for the duration of the COVID-19 declaration under Section 564(b)(1) of the Act, 21 U.S.C.section 360bbb-3(b)(1), unless the authorization is terminated  or revoked sooner.       Influenza A by PCR NEGATIVE NEGATIVE Final   Influenza B by PCR NEGATIVE NEGATIVE Final    Comment: (NOTE) The Xpert Xpress SARS-CoV-2/FLU/RSV plus assay is intended as an aid in the diagnosis of influenza from Nasopharyngeal swab specimens and should not be used as a sole basis for treatment. Nasal washings and aspirates are unacceptable for Xpert Xpress SARS-CoV-2/FLU/RSV testing.  Fact Sheet for Patients: EntrepreneurPulse.com.au  Fact Sheet for Healthcare Providers: IncredibleEmployment.be  This test is not yet  approved or cleared by the [place] FDA and has been authorized for detection and/or diagnosis of SARS-CoV-2 by FDA under an Emergency Use Authorization (EUA). This EUA will remain in effect (meaning this test can be used) for the duration of the COVID-19 declaration under Section 564(b)(1) of the Act, 21 U.S.C. section 360bbb-3(b)(1), unless the authorization is terminated or revoked.  Performed at [facility], [address] [place], [place] [address]      Labs: BNP (last 3 results) No results for input(s): BNP in the last 8760 hours. Basic Metabolic Panel: Recent Labs  Lab 07/06/20 1846 07/06/20 2256 07/07/20 0415 07/07/20 2002 07/08/20 0520  NA 135 136 134* 136 135  K 4.7 3.9 3.6 3.3* 3.3*  CL 97* 103 102 104 103  CO2 18* 20* 24 23 25   GLUCOSE 456* 272* 301* 116* 101*  BUN 30* 23 23 19 17   CREATININE 0.85 0.59 0.62 0.45 0.53  CALCIUM 9.0 8.3* 8.7* 8.4* 8.7*  MG 1.9  --   --   --   --    Liver Function Tests: No results for input(s): AST, ALT, ALKPHOS, BILITOT, PROT, ALBUMIN in the last 168 hours. No results for input(s): LIPASE, AMYLASE in the last 168 hours. No results for input(s): AMMONIA in the last 168 hours. CBC: Recent Labs  Lab 07/06/20 1544  WBC 8.9  HGB 15.2*  HCT 43.9  MCV 94.0  PLT 189   Cardiac Enzymes: No results for input(s): CKTOTAL, CKMB, CKMBINDEX, TROPONINI in the last 168 hours. BNP: Invalid input(s): POCBNP CBG: Recent Labs  Lab 07/07/20 0930 07/07/20 1436 07/07/20 1756 07/07/20 2101 07/08/20 0518  GLUCAP 133* 126* 116* 103* 95   D-Dimer No results for input(s): DDIMER in the last 72 hours. Hgb A1c Recent Labs    07/07/20 1800  HGBA1C 11.8*   Lipid Profile No results for input(s): CHOL, HDL, LDLCALC, TRIG, CHOLHDL, LDLDIRECT in the last 72 hours. Thyroid function studies No results for input(s): TSH, T4TOTAL, T3FREE, THYROIDAB in the last 72 hours.  Invalid input(s): FREET3 Anemia work up No results  for input(s): VITAMINB12, FOLATE, FERRITIN, TIBC, IRON, RETICCTPCT in the last 72 hours. Urinalysis    Component Value Date/Time   COLORURINE YELLOW (A) 07/06/2020 1752   APPEARANCEUR HAZY (A) 07/06/2020 1752   LABSPEC 1.027 07/06/2020 1752   PHURINE 5.0 07/06/2020 1752   GLUCOSEU >=500 (A) 07/06/2020 1752   HGBUR SMALL (A) 07/06/2020 1752   BILIRUBINUR NEGATIVE 07/06/2020 1752   KETONESUR 80 (A) 07/06/2020 1752   PROTEINUR NEGATIVE 07/06/2020 1752   NITRITE NEGATIVE 07/06/2020 1752   LEUKOCYTESUR TRACE (A) 07/06/2020 1752   Sepsis Labs Invalid input(s): PROCALCITONIN,  WBC,  LACTICIDVEN Microbiology Recent Results (from the  past 240 hour(s))  Resp Panel by RT-PCR (Flu A&B, Covid) Nasopharyngeal Swab     Status: None   Collection Time: 07/06/20  6:46 PM   Specimen: Nasopharyngeal Swab; Nasopharyngeal(NP) swabs in vial transport medium  Result Value Ref Range Status   SARS Coronavirus 2 by RT PCR NEGATIVE NEGATIVE Final    Comment: (NOTE) SARS-CoV-2 target nucleic acids are NOT DETECTED.  The SARS-CoV-2 RNA is generally detectable in upper respiratory specimens during the acute phase of infection. The lowest concentration of SARS-CoV-2 viral copies this assay can detect is 138 copies/mL. A negative result does not preclude SARS-Cov-2 infection and should not be used as the sole basis for treatment or other patient management decisions. A negative result may occur with  improper specimen collection/handling, submission of specimen other than nasopharyngeal swab, presence of viral mutation(s) within the areas targeted by this assay, and inadequate number of viral copies(<138 copies/mL). A negative result must be combined with clinical observations, patient history, and epidemiological information. The expected result is Negative.  Fact Sheet for Patients:  EntrepreneurPulse.com.au  Fact Sheet for Healthcare Providers:   IncredibleEmployment.be  This test is no t yet approved or cleared by the [place] FDA and  has been authorized for detection and/or diagnosis of SARS-CoV-2 by FDA under an Emergency Use Authorization (EUA). This EUA will remain  in effect (meaning this test can be used) for the duration of the COVID-19 declaration under Section 564(b)(1) of the Act, 21 U.S.C.section 360bbb-3(b)(1), unless the authorization is terminated  or revoked sooner.       Influenza A by PCR NEGATIVE NEGATIVE Final   Influenza B by PCR NEGATIVE NEGATIVE Final    Comment: (NOTE) The Xpert Xpress SARS-CoV-2/FLU/RSV plus assay is intended as an aid in the diagnosis of influenza from Nasopharyngeal swab specimens and should not be used as a sole basis for treatment. Nasal washings and aspirates are unacceptable for Xpert Xpress SARS-CoV-2/FLU/RSV testing.  Fact Sheet for Patients: EntrepreneurPulse.com.au  Fact Sheet for Healthcare Providers: IncredibleEmployment.be  This test is not yet approved or cleared by the [place] FDA and has been authorized for detection and/or diagnosis of SARS-CoV-2 by FDA under an Emergency Use Authorization (EUA). This EUA will remain in effect (meaning this test can be used) for the duration of the COVID-19 declaration under Section 564(b)(1) of the Act, 21 U.S.C. section 360bbb-3(b)(1), unless the authorization is terminated or revoked.  Performed at [facility], [address], [place], [place] [address]      Time coordinating discharge: Over 30 minutes  SIGNED:   Darliss Cheney, MD  [facility] 07/08/2020, 10:24 AM  If 7PM-7AM, please contact night-coverage [email or web address]

## 2020-07-08 NOTE — ED Notes (Signed)
Pt changed and placed in new depends. Pt repositioned in bed.

## 2020-07-13 LAB — CBG MONITORING, ED: Glucose-Capillary: >600 mg/dL (ref 70–99)

## 2020-08-01 ENCOUNTER — Inpatient Hospital Stay
Admission: EM | Admit: 2020-08-01 | Discharge: 2020-08-29 | DRG: 435 | Disposition: E | Payer: Medicare Other | Attending: Internal Medicine | Admitting: Internal Medicine

## 2020-08-01 ENCOUNTER — Emergency Department: Payer: Medicare Other

## 2020-08-01 ENCOUNTER — Encounter: Payer: Self-pay | Admitting: Emergency Medicine

## 2020-08-01 ENCOUNTER — Other Ambulatory Visit: Payer: Self-pay

## 2020-08-01 DIAGNOSIS — Z681 Body mass index (BMI) 19 or less, adult: Secondary | ICD-10-CM | POA: Diagnosis not present

## 2020-08-01 DIAGNOSIS — E039 Hypothyroidism, unspecified: Secondary | ICD-10-CM | POA: Diagnosis present

## 2020-08-01 DIAGNOSIS — E785 Hyperlipidemia, unspecified: Secondary | ICD-10-CM | POA: Diagnosis present

## 2020-08-01 DIAGNOSIS — R17 Unspecified jaundice: Secondary | ICD-10-CM

## 2020-08-01 DIAGNOSIS — Z7984 Long term (current) use of oral hypoglycemic drugs: Secondary | ICD-10-CM | POA: Diagnosis not present

## 2020-08-01 DIAGNOSIS — K8021 Calculus of gallbladder without cholecystitis with obstruction: Secondary | ICD-10-CM | POA: Diagnosis present

## 2020-08-01 DIAGNOSIS — Z794 Long term (current) use of insulin: Secondary | ICD-10-CM | POA: Diagnosis not present

## 2020-08-01 DIAGNOSIS — U071 COVID-19: Secondary | ICD-10-CM | POA: Clinically undetermined

## 2020-08-01 DIAGNOSIS — K831 Obstruction of bile duct: Secondary | ICD-10-CM | POA: Diagnosis not present

## 2020-08-01 DIAGNOSIS — I10 Essential (primary) hypertension: Secondary | ICD-10-CM | POA: Diagnosis present

## 2020-08-01 DIAGNOSIS — K828 Other specified diseases of gallbladder: Secondary | ICD-10-CM | POA: Diagnosis not present

## 2020-08-01 DIAGNOSIS — R531 Weakness: Secondary | ICD-10-CM

## 2020-08-01 DIAGNOSIS — E1165 Type 2 diabetes mellitus with hyperglycemia: Secondary | ICD-10-CM | POA: Diagnosis not present

## 2020-08-01 DIAGNOSIS — Z515 Encounter for palliative care: Secondary | ICD-10-CM

## 2020-08-01 DIAGNOSIS — K869 Disease of pancreas, unspecified: Secondary | ICD-10-CM | POA: Diagnosis not present

## 2020-08-01 DIAGNOSIS — R933 Abnormal findings on diagnostic imaging of other parts of digestive tract: Secondary | ICD-10-CM | POA: Diagnosis not present

## 2020-08-01 DIAGNOSIS — E1110 Type 2 diabetes mellitus with ketoacidosis without coma: Secondary | ICD-10-CM | POA: Diagnosis not present

## 2020-08-01 DIAGNOSIS — G309 Alzheimer's disease, unspecified: Secondary | ICD-10-CM | POA: Diagnosis present

## 2020-08-01 DIAGNOSIS — F0280 Dementia in other diseases classified elsewhere without behavioral disturbance: Secondary | ICD-10-CM | POA: Diagnosis present

## 2020-08-01 DIAGNOSIS — Z79890 Hormone replacement therapy (postmenopausal): Secondary | ICD-10-CM

## 2020-08-01 DIAGNOSIS — I959 Hypotension, unspecified: Secondary | ICD-10-CM | POA: Diagnosis present

## 2020-08-01 DIAGNOSIS — Z66 Do not resuscitate: Secondary | ICD-10-CM | POA: Diagnosis present

## 2020-08-01 DIAGNOSIS — Z7982 Long term (current) use of aspirin: Secondary | ICD-10-CM | POA: Diagnosis not present

## 2020-08-01 DIAGNOSIS — E43 Unspecified severe protein-calorie malnutrition: Secondary | ICD-10-CM | POA: Diagnosis not present

## 2020-08-01 DIAGNOSIS — E876 Hypokalemia: Secondary | ICD-10-CM | POA: Diagnosis present

## 2020-08-01 DIAGNOSIS — K5730 Diverticulosis of large intestine without perforation or abscess without bleeding: Secondary | ICD-10-CM | POA: Diagnosis not present

## 2020-08-01 DIAGNOSIS — R748 Abnormal levels of other serum enzymes: Secondary | ICD-10-CM

## 2020-08-01 DIAGNOSIS — R64 Cachexia: Secondary | ICD-10-CM | POA: Diagnosis not present

## 2020-08-01 DIAGNOSIS — Z79899 Other long term (current) drug therapy: Secondary | ICD-10-CM | POA: Diagnosis not present

## 2020-08-01 DIAGNOSIS — K838 Other specified diseases of biliary tract: Secondary | ICD-10-CM | POA: Diagnosis not present

## 2020-08-01 DIAGNOSIS — L8990 Pressure ulcer of unspecified site, unspecified stage: Secondary | ICD-10-CM | POA: Insufficient documentation

## 2020-08-01 DIAGNOSIS — K8020 Calculus of gallbladder without cholecystitis without obstruction: Secondary | ICD-10-CM | POA: Diagnosis not present

## 2020-08-01 DIAGNOSIS — K7689 Other specified diseases of liver: Secondary | ICD-10-CM | POA: Diagnosis not present

## 2020-08-01 DIAGNOSIS — Z0189 Encounter for other specified special examinations: Secondary | ICD-10-CM

## 2020-08-01 DIAGNOSIS — L89152 Pressure ulcer of sacral region, stage 2: Secondary | ICD-10-CM | POA: Diagnosis present

## 2020-08-01 DIAGNOSIS — E871 Hypo-osmolality and hyponatremia: Secondary | ICD-10-CM | POA: Diagnosis present

## 2020-08-01 DIAGNOSIS — Z4659 Encounter for fitting and adjustment of other gastrointestinal appliance and device: Secondary | ICD-10-CM | POA: Diagnosis not present

## 2020-08-01 DIAGNOSIS — C259 Malignant neoplasm of pancreas, unspecified: Principal | ICD-10-CM | POA: Diagnosis present

## 2020-08-01 DIAGNOSIS — Z1389 Encounter for screening for other disorder: Secondary | ICD-10-CM

## 2020-08-01 DIAGNOSIS — Z01818 Encounter for other preprocedural examination: Secondary | ICD-10-CM | POA: Diagnosis not present

## 2020-08-01 DIAGNOSIS — K8689 Other specified diseases of pancreas: Secondary | ICD-10-CM | POA: Diagnosis not present

## 2020-08-01 LAB — CBC
HCT: 40.1 % (ref 36.0–46.0)
Hemoglobin: 13.6 g/dL (ref 12.0–15.0)
MCH: 31.8 pg (ref 26.0–34.0)
MCHC: 33.9 g/dL (ref 30.0–36.0)
MCV: 93.7 fL (ref 80.0–100.0)
Platelets: 187 10*3/uL (ref 150–400)
RBC: 4.28 MIL/uL (ref 3.87–5.11)
RDW: 15.7 % — ABNORMAL HIGH (ref 11.5–15.5)
WBC: 7.2 10*3/uL (ref 4.0–10.5)
nRBC: 0 % (ref 0.0–0.2)

## 2020-08-01 LAB — CBC WITH DIFFERENTIAL/PLATELET
Abs Immature Granulocytes: 0.04 10*3/uL (ref 0.00–0.07)
Basophils Absolute: 0 10*3/uL (ref 0.0–0.1)
Basophils Relative: 0 %
Eosinophils Absolute: 0 10*3/uL (ref 0.0–0.5)
Eosinophils Relative: 0 %
HCT: 40.1 % (ref 36.0–46.0)
Hemoglobin: 13.7 g/dL (ref 12.0–15.0)
Immature Granulocytes: 1 %
Lymphocytes Relative: 10 %
Lymphs Abs: 0.7 10*3/uL (ref 0.7–4.0)
MCH: 31.9 pg (ref 26.0–34.0)
MCHC: 34.2 g/dL (ref 30.0–36.0)
MCV: 93.5 fL (ref 80.0–100.0)
Monocytes Absolute: 0.3 10*3/uL (ref 0.1–1.0)
Monocytes Relative: 5 %
Neutro Abs: 6.1 10*3/uL (ref 1.7–7.7)
Neutrophils Relative %: 84 %
Platelets: 195 10*3/uL (ref 150–400)
RBC: 4.29 MIL/uL (ref 3.87–5.11)
RDW: 15.8 % — ABNORMAL HIGH (ref 11.5–15.5)
WBC: 7.3 10*3/uL (ref 4.0–10.5)
nRBC: 0 % (ref 0.0–0.2)

## 2020-08-01 LAB — RESP PANEL BY RT-PCR (FLU A&B, COVID) ARPGX2
Influenza A by PCR: NEGATIVE
Influenza B by PCR: NEGATIVE
SARS Coronavirus 2 by RT PCR: NEGATIVE

## 2020-08-01 LAB — COMPREHENSIVE METABOLIC PANEL
ALT: 241 U/L — ABNORMAL HIGH (ref 0–44)
AST: 207 U/L — ABNORMAL HIGH (ref 15–41)
Albumin: 3.3 g/dL — ABNORMAL LOW (ref 3.5–5.0)
Alkaline Phosphatase: 817 U/L — ABNORMAL HIGH (ref 38–126)
Anion gap: 16 — ABNORMAL HIGH (ref 5–15)
BUN: 15 mg/dL (ref 8–23)
CO2: 24 mmol/L (ref 22–32)
Calcium: 9.4 mg/dL (ref 8.9–10.3)
Chloride: 89 mmol/L — ABNORMAL LOW (ref 98–111)
Creatinine, Ser: <0.3 mg/dL — ABNORMAL LOW (ref 0.44–1.00)
Glucose, Bld: 204 mg/dL — ABNORMAL HIGH (ref 70–99)
Potassium: 3.4 mmol/L — ABNORMAL LOW (ref 3.5–5.1)
Sodium: 129 mmol/L — ABNORMAL LOW (ref 135–145)
Total Bilirubin: 22.5 mg/dL (ref 0.3–1.2)
Total Protein: 7.3 g/dL (ref 6.5–8.1)

## 2020-08-01 LAB — LIPASE, BLOOD: Lipase: 29 U/L (ref 11–51)

## 2020-08-01 LAB — CBG MONITORING, ED: Glucose-Capillary: 186 mg/dL — ABNORMAL HIGH (ref 70–99)

## 2020-08-01 MED ORDER — LACTATED RINGERS IV SOLN: INTRAVENOUS | Status: DC

## 2020-08-01 MED ORDER — IOHEXOL 300 MG/ML  SOLN
75.0000 mL | Freq: Once | INTRAMUSCULAR | Status: AC | PRN
  Administered 2020-08-01: 75 mL via INTRAVENOUS

## 2020-08-01 MED ORDER — SODIUM CHLORIDE 0.9 % IV BOLUS
1000.0000 mL | Freq: Once | INTRAVENOUS | Status: AC
  Administered 2020-08-01: 1000 mL via INTRAVENOUS

## 2020-08-01 MED ORDER — IOHEXOL 9 MG/ML PO SOLN
500.0000 mL | ORAL | Status: AC
  Administered 2020-08-01: 500 mL via ORAL

## 2020-08-01 NOTE — ED Triage Notes (Addendum)
Pt sent by gynecologist, found to be hypotensive and jaundice at routine physical appt. Hx of Alzheimer's, DM, recent weight loss w/poor appetite. CBG in 70's this morning when she woke, normally much higher per step-daughter who is present on arrival. 91/57 in triage

## 2020-08-01 NOTE — H&P (Signed)
History and Physical    PLEASE NOTE THAT DRAGON DICTATION SOFTWARE WAS USED IN THE CONSTRUCTION OF THIS NOTE.   Deborah Thornton [id number] DOB: 1946-04-24 DOA: 08/09/2020  PCP: Pcp, No Patient coming from: home   I have personally briefly reviewed patient's old medical records in [place]  Chief Complaint: Jaundice  HPI: Deborah Thornton is a 75 y.o. female with medical history significant for type 2 diabetes mellitus with most recent hemoglobin A1c 11.8% in December 2021, Alzheimer's dementia, hyperlipidemia, acquired hypothyroidism, hypertension, who is admitted to [facility] on 08/02/2020 with obstructive jaundice in the setting of newly identified pancreatic mass after presenting from her PCPs office to [facility] Emergency Department for further evaluation of jaundice.  The patient presented to the office of her PCP earlier today for evaluation of recent development of jaundice, at which time PCP instructed the patient to present to the emergency department for further evaluate of such. She is been experiencing progressive decline in oral intake in the setting of diminished appetite. Consequently, she has experienced unintentional recent weight loss, although the quantity of this weight loss is not completely clear at this time. Not associate with any abdominal discomfort or preceding trauma. Not associate with any recent nausea or vomiting.  Denies any recent subjective fever, chills, rigors, or generalized myalgias. Denies any headache, neck stiffness, rhinitis, rhinorrhea shortness of breath, wheezing, cough, or diarrhea. No recent traveling or known COVID-19 exposures. Denies any recent dysuria or gross hematuria. Not associate with any recent chest pain, palpitations, diaphoresis.  Patient's medical history is notable for history of type 2 diabetes mellitus, with most recent hemoglobin A1c noted to be 11.8% on 07/07/2020. She is on both basal and short acting  insulin as an outpatient. Additionally, she carries a diagnosis of acquired hypothyroidism for which she is on supplementation via Synthroid at home.    ED Course:  Vital signs in the ED were notable for the following: Temperature max 97.7; heart rate 63-87; initial blood pressure noted to be 91/57, which is subsequently improved to 108/66 following administration of IV fluids, as further quantified below; respiratory rate 17-20, oxygen saturation 97 to 100% on room air.  Labs were notable for the following: CMP was notable for the following: Sodium 129 relative to most recent prior serum sodium data point 135 on 07/08/2020, potassium 3.7, chloride 89, bicarbonate 24, creatinine 0.3, glucose 204, calcium 9.4, albumin 3.3. CMP was also notable for the following liver enzyme results: Alkaline phosphatase 817, AST 2 7, ALT 211, total bilirubin 22.5. Most recent prior liver enzymes were performed in May 2018, at which time all values were found to be within normal limits. Lipase 29. Urinalysis has been ordered, with result currently pending. CBC was notable for white blood cell count of 7200 with hemoglobin 13.6. Screening nasopharyngeal COVID-19/influenza PCR was performed in the ED this evening and found to be negative.   Right upper quadrant ultrasound showed gallbladder sludge with cholelithiasis, common bile duct dilated to 14 mm without radiopaque choledocholithiasis, and showed prominence of the pancreatic duct as well. CT abdomen/pelvis with contrast, compared to imaging performed in January 2009, showed interval development of soft tissue mass within the pancreatic head consistent with pancreatic neoplasm, and also showed prominent dilation of the central intrahepatic bile duct, common bile duct, and pancreatic duct without evidence of intraductal stones, while also showing multiple subcentimeter splenic lesions.   While in the ED, the following were administered: Normal saline x1 L bolus.  Separately, the patient was admitted to the med telemetry floor for further evaluation and management of obstructive jaundice in the setting of finding of pancreatic mass.     Review of Systems: As per HPI otherwise 10 point review of systems negative.   Past Medical History:  Diagnosis Date  . Dementia ([place])   . Diabetes mellitus without complication [facility])     Past Surgical History:  Procedure Laterality Date  . TONSILLECTOMY AND ADENOIDECTOMY  1954    Social History:  reports that she has never smoked. She has never used smokeless tobacco. She reports that she does not drink alcohol and does not use drugs.   Allergies  Allergen Reactions  . Penicillin G Other (See Comments)  . Rofecoxib Other (See Comments)  . Sulfa Antibiotics Other (See Comments)    Family History  Problem Relation Age of Onset  . Cancer Mother      Prior to Admission medications   Medication Sig Start Date End Date Taking? Authorizing Provider  aspirin EC 81 MG tablet Take 81 mg by mouth daily. Swallow whole.    [provider]  cholecalciferol (VITAMIN D) 25 MCG (1000 UNIT) tablet Take 2,000 Units by mouth daily.    [provider]  diltiazem (TIAZAC) 120 MG 24 hr capsule Take 120 mg by mouth daily.    [provider]  donepezil (ARICEPT) 10 MG tablet Take 10 mg by mouth daily. 06/14/20   [provider]  insulin aspart (NOVOLOG) 100 UNIT/ML injection Inject 3 Units into the skin 3 (three) times daily with meals. 07/08/20   Darliss Cheney, MD  insulin glargine (LANTUS) 100 UNIT/ML injection Inject 0.15 mLs (15 Units total) into the skin daily. 07/09/20   Darliss Cheney, MD  levothyroxine (SYNTHROID) 25 MCG tablet Take 25 mcg by mouth daily before breakfast.    [provider]  lisinopril (ZESTRIL) 10 MG tablet Take 10 mg by mouth in the morning and at bedtime.    [provider]  metFORMIN (GLUCOPHAGE) 500 MG tablet Take 500 mg by mouth 3 (three)  times daily.    [provider]  Multiple Vitamins-Minerals (MULTIVITAMIN WITH MINERALS) tablet Take 1 tablet by mouth daily.    [provider]  simvastatin (ZOCOR) 40 MG tablet Take 40 mg by mouth daily.    [provider]     Objective    Physical Exam: Vitals:   08/02/2020 1805 07/29/2020 1835 08/15/2020 1930 08/09/2020 2000  BP: 99/61 94/61 98/68  108/66  Pulse: 60 85 73 63  Resp: 11 17 20 18   Temp:      TempSrc:      SpO2: 99% 99% 99% 100%  Weight:        General: appears to be stated age; alert; jaundiced Skin: warm, dry, jaundice noted Head:  AT/[place] Mouth:  Oral mucosa membranes appear dry, normal dentition Neck: supple; trachea midline Heart:  RRR; did not appreciate any M/R/G Lungs: CTAB, did not appreciate any wheezes, rales, or rhonchi Abdomen: + BS; soft, ND, NT Vascular: 2+ pedal pulses b/l; 2+ radial pulses b/l Extremities: no peripheral edema, no muscle wasting Neuro: strength and sensation intact in upper and lower extremities b/l   Labs on Admission: I have personally reviewed following labs and imaging studies  CBC: Recent Labs  Lab 07/30/2020 1451  WBC 7.3  7.2  NEUTROABS 6.1  HGB 13.7  13.6  HCT 40.1  40.1  MCV 93.5  93.7  PLT 195  187  Basic Metabolic Panel: Recent Labs  Lab 08/25/2020 1451  NA 129*  K 3.4*  CL 89*  CO2 24  GLUCOSE 204*  BUN 15  CREATININE <0.30*  CALCIUM 9.4   GFR: CrCl cannot be calculated (This lab value cannot be used to calculate CrCl because it is not a number: <0.30). Liver Function Tests: Recent Labs  Lab 07/31/2020 1451  AST 207*  ALT 241*  ALKPHOS 817*  BILITOT 22.5*  PROT 7.3  ALBUMIN 3.3*   Recent Labs  Lab 08/03/2020 1451  LIPASE 29   No results for input(s): AMMONIA in the last 168 hours. Coagulation Profile: No results for input(s): INR, PROTIME in the last 168 hours. Cardiac Enzymes: No results for input(s): CKTOTAL, CKMB, CKMBINDEX, TROPONINI in the last 168  hours. BNP (last 3 results) No results for input(s): PROBNP in the last 8760 hours. HbA1C: No results for input(s): HGBA1C in the last 72 hours. CBG: Recent Labs  Lab 08/08/2020 1408  GLUCAP 186*   Lipid Profile: No results for input(s): CHOL, HDL, LDLCALC, TRIG, CHOLHDL, LDLDIRECT in the last 72 hours. Thyroid Function Tests: No results for input(s): TSH, T4TOTAL, FREET4, T3FREE, THYROIDAB in the last 72 hours. Anemia Panel: No results for input(s): VITAMINB12, FOLATE, FERRITIN, TIBC, IRON, RETICCTPCT in the last 72 hours. Urine analysis:    Component Value Date/Time   COLORURINE YELLOW (A) 07/06/2020 1752   APPEARANCEUR HAZY (A) 07/06/2020 1752   LABSPEC 1.027 07/06/2020 1752   PHURINE 5.0 07/06/2020 1752   GLUCOSEU >=500 (A) 07/06/2020 1752   HGBUR SMALL (A) 07/06/2020 1752   BILIRUBINUR NEGATIVE 07/06/2020 1752   KETONESUR 80 (A) 07/06/2020 1752   PROTEINUR NEGATIVE 07/06/2020 1752   NITRITE NEGATIVE 07/06/2020 1752   LEUKOCYTESUR TRACE (A) 07/06/2020 1752    Radiological Exams on Admission: CT ABDOMEN PELVIS W CONTRAST  Result Date: 08/18/2020 CLINICAL DATA:  Hypotension and jaundice. EXAM: CT ABDOMEN AND PELVIS WITH CONTRAST TECHNIQUE: Multidetector CT imaging of the abdomen and pelvis was performed using the standard protocol following bolus administration of intravenous contrast. CONTRAST:  58mL OMNIPAQUE IOHEXOL 300 MG/ML  SOLN COMPARISON:  August 10, 2007 FINDINGS: Lower chest: A small pericardial effusion is seen. Hepatobiliary: No focal liver abnormality is seen. There is marked severity, predominant central intrahepatic biliary dilatation. Subcentimeter gallstones are seen within the dependent portion of a markedly distended gallbladder. The common bile duct is dilated and measures approximately 1.8 cm. Pancreas: A 2.7 cm x 1.9 cm x 2.1 cm low-attenuation soft tissue mass is seen within the pancreatic head. This represents a new finding when compared to the prior study.  Dilatation of the pancreatic duct is also noted (6.1 mm). Spleen: Multiple subcentimeter ill-defined foci of parenchymal low attenuation are seen scattered throughout the spleen. These areas are mildly increased in number when compared to the prior study. An additional 1.2 cm x 1.0 cm ill-defined low-attenuation splenic lesion is seen (axial CT image 17, CT series number 2). Adrenals/Urinary Tract: Adrenal glands are unremarkable. Kidneys are normal in size, without renal calculi or hydronephrosis. A 6 mm cystic appearing area is seen within the mid left kidney. Bladder is unremarkable. Stomach/Bowel: Stomach is within normal limits. The appendix is not clearly visualized. No evidence of bowel dilatation. Noninflamed diverticula are seen throughout the large bowel. Mild diffuse colonic wall thickening is seen, however, it should be noted that the majority of the large bowel is decompressed. Vascular/Lymphatic: Aortic atherosclerosis. No enlarged abdominal or pelvic lymph nodes. Reproductive: Uterus and bilateral adnexa  are unremarkable. Other: No abdominal wall hernia or abnormality. A mild amount of abdominal and pelvic free fluid is seen. Musculoskeletal: Degenerative changes seen throughout the lumbar spine. IMPRESSION: 1. Soft tissue mass within the pancreatic head which represents a new finding when compared to the prior study and is concerning for the presence of a pancreatic neoplasm. MRI correlation is recommended. 2. Marked severity, predominant central intrahepatic biliary dilatation, as well as dilatation of the common bile duct and pancreatic duct. 3. Cholelithiasis. 4. Mild amount of abdominal and pelvic free fluid. 5. Multiple predominantly subcentimeter splenic lesions, as described above. MRI correlation is recommended. Reference: J Am Coll Radiol 2013;10:833-839. 6. Colonic diverticulosis. 7. Aortic atherosclerosis. Aortic Atherosclerosis (ICD10-I70.0). Electronically Signed   By: Virgina Norfolk  M.D.   On: 08/23/2020 19:49   US Abdomen Limited RUQ (LIVER/GB)  Result Date: 08/13/2020 CLINICAL DATA:  Jaundice and elevated LFTs EXAM: ULTRASOUND ABDOMEN LIMITED RIGHT UPPER QUADRANT COMPARISON:  None. FINDINGS: Gallbladder: Gallbladder is well distended with gallstones and gallbladder sludge within. No wall thickness or pericholecystic fluid is noted. Common bile duct: Diameter: 14 mm Liver: Intrahepatic biliary ductal dilatation is noted. Mild increased echogenicity is noted consistent with fatty infiltration. No mass lesion is seen. Portal vein is patent on color Doppler imaging with normal direction of blood flow towards the liver. Other: Imaging of the pancreas was performed given the dilatation of the common bile duct. There is prominence of the pancreatic duct is well although the head and uncinate process for not well visualized. IMPRESSION: Gallbladder sludge and gallstones. Extrahepatic and intrahepatic biliary ductal dilatation. Given the clinical findings, CT of the abdomen and pelvis with contrast is recommended for further evaluation. Electronically Signed   By: Inez Catalina M.D.   On: 08/15/2020 16:44     Assessment/Plan   Amor A Thornton is a 75 y.o. female with medical history significant for type 2 diabetes mellitus with most recent hemoglobin A1c 11.8% in December 2021, Alzheimer's dementia, hyperlipidemia, acquired hypothyroidism, hypertension, who is admitted to [facility] on 08/08/2020 with obstructive jaundice in the setting of newly identified pancreatic mass after presenting from her PCPs office to [facility] Emergency Department for further evaluation of jaundice.   Principal Problem:   Obstructive jaundice Active Problems:   Essential hypertension   Pancreatic mass   Acute hyponatremia   Hypokalemia   Acquired hypothyroidism    #) Obstructive jaundice: Presenting labs reflect cholestatic pattern in the setting of elevated alkaline phosphatase as  well as elevated total bilirubin with physical exam evidence of jaundice, CT abdomen/pelvis showing prominent dilation of the central intrahepatic bile duct, common bile duct, and pancreatic ducts without evidence of intraductal stone, but in the presence of a newly identified soft tissue mass within the pancreatic head raising concern for malignancy, particularly given report of recent unintentional weight loss the presence of painless jaundice. Overall, suspect obstructive jaundice due to extrinsic compression of the extrahepatic and intrahepatic bile ducts due to pancreatic mass. We will pursue MRI of the abdomen to further evaluate this newly identified mass in the head of the pancreas. Additionally, will consult gastroenterology assistance with further evaluation management presenting obstructive jaundice, including consideration for bile ductal stenting as well as recommendations regarding accessibility of the mass in the head of the pancreas for biopsy and associated histopathologic identification there of.   Plan: MRI of the abdomen, as above. Repeat CMP in the morning. Direct bilirubin. Check GGT. Lactated Ringer's at 500 cc/h. Check INR. Gastroenterology consultation,  as above. Keeping n.p.o. for now, pending input from ensuing gastroenterology consult.    #) Pancreatic mass: This evening's imaging demonstrates a soft tissue mass within the pancreatic head concerning for pancreatic neoplasm, and representing a new finding imaging performed in January 2009. Concerning for malignancy is suspected to represent the extrinsic compressive factor leading to the patient's presenting obstructive jaundice, as further described above.  Plan: MRI of the abdomen, as above. Gastroenterology for input regarding accessibility of the pancreatic mass to biopsy for histopathologic identification of this lesion.     #) Protein calorie malnutrition, patient exhibits physical exam evidence consistent with protein  calorie malnutrition, while presenting BMI is found to be 17. This is in the setting of recent decline in oral intake contributed by recent decline in appetite and associated unintentional recent weight loss.   Plan: check prealbumin level to establish diagnosis of protein calorie malnutrition along with the severity associated with it, with potential for subsequent consultation dietary for assistance with nutritional optimization including recommendations regarding calorically dense supplements. Recommend management of newly identified pancreatic mass, as above.      #) Acute hypoosmolar hyponatremia: Presenting serum sodium found to be 129, relative to most recent prior value 135 on 07/08/2020. Suspect that this is hypovolemic in nature given patient's recent significant decline in oral intake. Differential also includes paraneoplastic syndrome in the setting of suspected underlying malignancy versus SIADH versus suboptimal thyroid supplementation in the setting of a documented history of acquired hypothyroidism on Synthroid as an outpatient. Not on any diuretics, including no HCTZ.  Plan: Gentle IV fluids overnight. Monitor strict I's and O's and daily weights. Repeat BMP in the morning. Check TSH. Add on random urine sodium as well as urine osmolality. Check serum osmolality to confirm suspected hypoosmolar etiology.      #) Hypokalemia: Presenting serum potassium level found to be mildly depressed at 3.4. Suspect contribution from significant recent decline in oral intake.   Plan: Potassium chloride 40 mEq IV every 4 hours x1 now. Add on serum magnesium level. Repeat BMP in the morning.     #) Acquired hypothyroidism: On Synthroid as an outpatient.  Plan: In the setting of recent unintentional weight loss as well as presenting acute hyponatremia, will check TSH. Continue home dose of Synthroid for now.     #) Essential hypertension: Outpatient antihypertensive regimen includes  lisinopril and diltiazem.  Plan: Soft initial blood pressures upon arrival to the ED, will hold antihypertensive medications for now, and engage in close monitoring of ensuing blood pressures via routine vital signs.      #) Type 2 diabetes mellitus: Associated with poor glycemic control, with most recent hemoglobin A1c noted to be 11.8% when checked in December 2021. Current outpatient insulin regimen consists of Lantus 15 subcu daily as well as NovoLog 3 units subcu 3 times daily. Presenting blood sugar per presenting CMP noted to be 204. Given the patient's significant recent decline in oral intake, will be conservative with approach to insulin during his hospitalization so as to avoid hypoglycemic outcome, as further quantified below.   Plan: will hold home basal insulin as well as scheduled short acting insulin for now. In the setting of current n.p.o. status, if ordered Accu-Cheks every 6 hours with low-dose sliding scale insulin.    DVT prophylaxis: scd's  Code Status: DNR/DNI Family Communication: none Disposition Plan: Per Rounding Team Admission status: Inpatient med telemetry    Of note, this patient was added by me to the following Admit  List/Treatment Team:  armcadmits     PLEASE NOTE THAT DRAGON DICTATION SOFTWARE WAS USED IN THE CONSTRUCTION OF THIS NOTE.   Angie Fava DO [facility] Pager [phone] From 12PM- 12AM  Otherwise, please contact night-coverage  [email or web address] Password [email or web address]  08/22/2020, 9:02 PM

## 2020-08-01 NOTE — ED Notes (Signed)
Pt changed into clean brief and gown. Pt placed on monitor. RN at bedside

## 2020-08-01 NOTE — Progress Notes (Signed)
Brief note regarding plan, with full H&P to follow:  75 year old female who is being admitted to [facility] for further evaluation and management of presenting obstructive jaundice in setting of concern for extrinsic compression due to new diagnosis of pancreatic mass per CT abdomen/pelvis performed tonight.  No evidence of radiopaque cbd stone on CT scan. Will consult GI for assistance with additional evaluation management of the above.  Keeping n.p.o. for now.  Gentle IV fluids.    Newton Pigg, DO Hospitalist

## 2020-08-01 NOTE — ED Provider Notes (Signed)
[facility] Emergency Department Provider Note   ____________________________________________   Event Date/Time   First MD Initiated Contact with Patient 08/23/2020 1554     (approximate)  I have reviewed the triage vital signs and the nursing notes.   HISTORY  Chief Complaint Jaundice (//) and Hypotension  History limited by dementia  HPI Deborah Thornton is a 75 y.o. female sent from doctor's office for low blood pressure and jaundice.  Past medical history includes Alzheimer's disease and diabetes with recent weight loss and low appetite.         Past Medical History:  Diagnosis Date  . Dementia (HCC)   . Diabetes mellitus without complication [facility])     Patient Active Problem List   Diagnosis Date Noted  . Diabetic ketoacidosis associated with type 2 diabetes mellitus (HCC) 07/06/2020  . Hyperlipidemia associated with type 2 diabetes mellitus (HCC) 07/06/2020  . Hypertension associated with diabetes (HCC) 07/06/2020  . Hypothyroidism 07/06/2020  . Alzheimer's dementia without behavioral disturbance (HCC) 04/09/2017    Past Surgical History:  Procedure Laterality Date  . TONSILLECTOMY AND ADENOIDECTOMY  1954    Prior to Admission medications   Medication Sig Start Date End Date Taking? Authorizing Provider  aspirin EC 81 MG tablet Take 81 mg by mouth daily. Swallow whole.    [provider]  cholecalciferol (VITAMIN D) 25 MCG (1000 UNIT) tablet Take 2,000 Units by mouth daily.    [provider]  diltiazem (TIAZAC) 120 MG 24 hr capsule Take 120 mg by mouth daily.    [provider]  donepezil (ARICEPT) 10 MG tablet Take 10 mg by mouth daily. 06/14/20   [provider]  insulin aspart (NOVOLOG) 100 UNIT/ML injection Inject 3 Units into the skin 3 (three) times daily with meals. 07/08/20   Hughie Closs, MD  insulin glargine (LANTUS) 100 UNIT/ML injection Inject 0.15 mLs (15 Units total) into the skin  daily. 07/09/20   Hughie Closs, MD  levothyroxine (SYNTHROID) 25 MCG tablet Take 25 mcg by mouth daily before breakfast.    [provider]  lisinopril (ZESTRIL) 10 MG tablet Take 10 mg by mouth in the morning and at bedtime.    [provider]  metFORMIN (GLUCOPHAGE) 500 MG tablet Take 500 mg by mouth 3 (three) times daily.    [provider]  Multiple Vitamins-Minerals (MULTIVITAMIN WITH MINERALS) tablet Take 1 tablet by mouth daily.    [provider]  simvastatin (ZOCOR) 40 MG tablet Take 40 mg by mouth daily.    [provider]    Allergies Penicillin g, Rofecoxib, and Sulfa antibiotics  Family History  Problem Relation Age of Onset  . Cancer Mother     Social History Social History   Tobacco Use  . Smoking status: Never Smoker  . Smokeless tobacco: Never Used  Substance Use Topics  . Alcohol use: Never  . Drug use: Never    Review of Systems  Constitutional: No fever/chills Eyes: No visual changes. ENT: No sore throat. Cardiovascular: Denies chest pain. Respiratory: Denies shortness of breath. Gastrointestinal: No abdominal pain.  No nausea, no vomiting.  No diarrhea.  No constipation. Genitourinary: Negative for dysuria. Musculoskeletal: Negative for back pain. Skin: Negative for rash. Neurological: Negative for headaches, focal weakness   ____________________________________________   PHYSICAL EXAM:  VITAL SIGNS: ED Triage Vitals  Enc Vitals Group     BP 07/30/2020 1404 (!) 91/57     Pulse Rate 07/30/2020 1404 87  Resp 08/23/20 1404 18     Temp 08/23/2020 1404 97.7 F (36.5 C)     Temp Source Aug 23, 2020 1404 Oral     SpO2 08/23/2020 1404 100 %     Weight 08-23-2020 1401 90 lb 0.2 oz (40.8 kg)     Height --      Head Circumference --      Peak Flow --      Pain Score --      Pain Loc --      Pain Edu? --      Excl. in [place]? --     Constitutional: Alert and oriented. Well appearing and in no acute  distress. Eyes: Conjunctivae are normal but jaundiced. PER Head: Atraumatic. Nose: No congestion/rhinnorhea. Mouth/Throat: Mucous membranes are moist.  Oropharynx non-erythematous. Neck: No stridor. Cardiovascular: Normal rate, regular rhythm. Grossly normal heart sounds.  Good peripheral circulation. Respiratory: Normal respiratory effort.  No retractions. Lungs CTAB. Gastrointestinal: Soft and nontender!  No palpable organomegaly no distention. No abdominal bruits. No CVA tenderness. Musculoskeletal: No lower extremity tenderness trace bilateral edema.   Neurologic:  Normal speech and language. No gross focal neurologic deficits are appreciated.  Skin:  Skin is warm, dry and intact and jaundiced .  ____________________________________________   LABS (all labs ordered are listed, but only abnormal results are displayed)  Labs Reviewed  COMPREHENSIVE METABOLIC PANEL - Abnormal; Notable for the following components:      Result Value   Sodium 129 (*)    Potassium 3.4 (*)    Chloride 89 (*)    Glucose, Bld 204 (*)    Creatinine, Ser <0.30 (*)    Albumin 3.3 (*)    AST 207 (*)    ALT 241 (*)    Alkaline Phosphatase 817 (*)    Total Bilirubin 22.5 (*)    Anion gap 16 (*)    All other components within normal limits  CBC - Abnormal; Notable for the following components:   RDW 15.7 (*)    All other components within normal limits  CBC WITH DIFFERENTIAL/PLATELET - Abnormal; Notable for the following components:   RDW 15.8 (*)    All other components within normal limits  CBG MONITORING, ED - Abnormal; Notable for the following components:   Glucose-Capillary 186 (*)    All other components within normal limits  LIPASE, BLOOD  URINALYSIS, COMPLETE (UACMP) WITH MICROSCOPIC   ____________________________________________  EKG   ____________________________________________  RADIOLOGY Gertha Calkin, personally viewed and evaluated these images (plain radiographs) as  part of my medical decision making, as well as reviewing the written report by the radiologist.  ED MD interpretation: Ultrasound read by radiology shows markedly dilated gallbladder and common bile duct.  There is sludge in the gallbladder.  No obvious etiology for the dilated duct.  CT recommended by radiology and myself CT of the abdomen pelvis shows what appears to be a new pancreatic cancer at least a pancreatic mass which is probably the cause of the obstruction.  There also appear to be possible mets in the spleen.  Radiology is now recommending MRI.  Official radiology report(s): CT ABDOMEN PELVIS W CONTRAST  Result Date: Aug 23, 2020 CLINICAL DATA:  Hypotension and jaundice. EXAM: CT ABDOMEN AND PELVIS WITH CONTRAST TECHNIQUE: Multidetector CT imaging of the abdomen and pelvis was performed using the standard protocol following bolus administration of intravenous contrast. CONTRAST:  80mL OMNIPAQUE IOHEXOL 300 MG/ML  SOLN COMPARISON:  August 10, 2007 FINDINGS: Lower chest: A small  pericardial effusion is seen. Hepatobiliary: No focal liver abnormality is seen. There is marked severity, predominant central intrahepatic biliary dilatation. Subcentimeter gallstones are seen within the dependent portion of a markedly distended gallbladder. The common bile duct is dilated and measures approximately 1.8 cm. Pancreas: A 2.7 cm x 1.9 cm x 2.1 cm low-attenuation soft tissue mass is seen within the pancreatic head. This represents a new finding when compared to the prior study. Dilatation of the pancreatic duct is also noted (6.1 mm). Spleen: Multiple subcentimeter ill-defined foci of parenchymal low attenuation are seen scattered throughout the spleen. These areas are mildly increased in number when compared to the prior study. An additional 1.2 cm x 1.0 cm ill-defined low-attenuation splenic lesion is seen (axial CT image 17, CT series number 2). Adrenals/Urinary Tract: Adrenal glands are unremarkable.  Kidneys are normal in size, without renal calculi or hydronephrosis. A 6 mm cystic appearing area is seen within the mid left kidney. Bladder is unremarkable. Stomach/Bowel: Stomach is within normal limits. The appendix is not clearly visualized. No evidence of bowel dilatation. Noninflamed diverticula are seen throughout the large bowel. Mild diffuse colonic wall thickening is seen, however, it should be noted that the majority of the large bowel is decompressed. Vascular/Lymphatic: Aortic atherosclerosis. No enlarged abdominal or pelvic lymph nodes. Reproductive: Uterus and bilateral adnexa are unremarkable. Other: No abdominal wall hernia or abnormality. A mild amount of abdominal and pelvic free fluid is seen. Musculoskeletal: Degenerative changes seen throughout the lumbar spine. IMPRESSION: 1. Soft tissue mass within the pancreatic head which represents a new finding when compared to the prior study and is concerning for the presence of a pancreatic neoplasm. MRI correlation is recommended. 2. Marked severity, predominant central intrahepatic biliary dilatation, as well as dilatation of the common bile duct and pancreatic duct. 3. Cholelithiasis. 4. Mild amount of abdominal and pelvic free fluid. 5. Multiple predominantly subcentimeter splenic lesions, as described above. MRI correlation is recommended. Reference: J Am Coll Radiol 2013;10:833-839. 6. Colonic diverticulosis. 7. Aortic atherosclerosis. Aortic Atherosclerosis (ICD10-I70.0). Electronically Signed   By: Aram Candela M.D.   On: 2020-08-30 19:49   US Abdomen Limited RUQ (LIVER/GB)  Result Date: Aug 30, 2020 CLINICAL DATA:  Jaundice and elevated LFTs EXAM: ULTRASOUND ABDOMEN LIMITED RIGHT UPPER QUADRANT COMPARISON:  None. FINDINGS: Gallbladder: Gallbladder is well distended with gallstones and gallbladder sludge within. No wall thickness or pericholecystic fluid is noted. Common bile duct: Diameter: 14 mm Liver: Intrahepatic biliary ductal  dilatation is noted. Mild increased echogenicity is noted consistent with fatty infiltration. No mass lesion is seen. Portal vein is patent on color Doppler imaging with normal direction of blood flow towards the liver. Other: Imaging of the pancreas was performed given the dilatation of the common bile duct. There is prominence of the pancreatic duct is well although the head and uncinate process for not well visualized. IMPRESSION: Gallbladder sludge and gallstones. Extrahepatic and intrahepatic biliary ductal dilatation. Given the clinical findings, CT of the abdomen and pelvis with contrast is recommended for further evaluation. Electronically Signed   By: Alcide Clever M.D.   On: Aug 30, 2020 16:44    ____________________________________________   PROCEDURES  Procedure(s) performed (including Critical Care):  Procedures   ____________________________________________   INITIAL IMPRESSION / ASSESSMENT AND PLAN / ED COURSE     Patient with new pancreatic mass and marked jaundice low blood pressure.  We will get her in the hospital for stabilization.  Most likely this is cancer.  Radiology is recommending MRI.  Undoubtedly hematology  oncology will have other suggestions.         ____________________________________________   FINAL CLINICAL IMPRESSION(S) / ED DIAGNOSES  Final diagnoses:  Pancreatic mass  Biliary obstruction  Inanition (HCC)  Weakness     ED Discharge Orders    None      *Please note:  Deshawn A Thornton was evaluated in Emergency Department on 2020/08/09 for the symptoms described in the history of present illness. She was evaluated in the context of the global COVID-19 pandemic, which necessitated consideration that the patient might be at risk for infection with the SARS-CoV-2 virus that causes COVID-19. Institutional protocols and algorithms that pertain to the evaluation of patients at risk for COVID-19 are in a state of rapid change based on information  released by regulatory bodies including the CDC and federal and state organizations. These policies and algorithms were followed during the patient's care in the ED.  Some ED evaluations and interventions may be delayed as a result of limited staffing during and the pandemic.*   Note:  This document was prepared using Dragon voice recognition software and may include unintentional dictation errors.    Arnaldo Natal, MD 2020/08/09 2340

## 2020-08-02 ENCOUNTER — Inpatient Hospital Stay: Payer: Medicare Other

## 2020-08-02 ENCOUNTER — Encounter: Payer: Self-pay | Admitting: Internal Medicine

## 2020-08-02 DIAGNOSIS — K831 Obstruction of bile duct: Secondary | ICD-10-CM | POA: Diagnosis not present

## 2020-08-02 DIAGNOSIS — K8689 Other specified diseases of pancreas: Secondary | ICD-10-CM | POA: Diagnosis not present

## 2020-08-02 DIAGNOSIS — E871 Hypo-osmolality and hyponatremia: Secondary | ICD-10-CM | POA: Diagnosis present

## 2020-08-02 DIAGNOSIS — E876 Hypokalemia: Secondary | ICD-10-CM | POA: Diagnosis present

## 2020-08-02 DIAGNOSIS — E039 Hypothyroidism, unspecified: Secondary | ICD-10-CM | POA: Diagnosis present

## 2020-08-02 LAB — CBC
HCT: 40.6 % (ref 36.0–46.0)
Hemoglobin: 14.1 g/dL (ref 12.0–15.0)
MCH: 31.9 pg (ref 26.0–34.0)
MCHC: 34.7 g/dL (ref 30.0–36.0)
MCV: 91.9 fL (ref 80.0–100.0)
Platelets: 198 10*3/uL (ref 150–400)
RBC: 4.42 MIL/uL (ref 3.87–5.11)
RDW: 15.8 % — ABNORMAL HIGH (ref 11.5–15.5)
WBC: 6.6 10*3/uL (ref 4.0–10.5)
nRBC: 0 % (ref 0.0–0.2)

## 2020-08-02 LAB — COMPREHENSIVE METABOLIC PANEL
AST: 139 U/L — ABNORMAL HIGH (ref 15–41)
Albumin: 2.5 g/dL — ABNORMAL LOW (ref 3.5–5.0)
Alkaline Phosphatase: 654 U/L — ABNORMAL HIGH (ref 38–126)
Anion gap: 12 (ref 5–15)
BUN: 13 mg/dL (ref 8–23)
CO2: 23 mmol/L (ref 22–32)
Calcium: 9 mg/dL (ref 8.9–10.3)
Chloride: 94 mmol/L — ABNORMAL LOW (ref 98–111)
Glucose, Bld: 199 mg/dL — ABNORMAL HIGH (ref 70–99)
Potassium: 4.8 mmol/L (ref 3.5–5.1)
Sodium: 129 mmol/L — ABNORMAL LOW (ref 135–145)
Total Bilirubin: 18.4 mg/dL (ref 0.3–1.2)
Total Protein: 5.9 g/dL — ABNORMAL LOW (ref 6.5–8.1)

## 2020-08-02 LAB — MAGNESIUM
Magnesium: 1.9 mg/dL (ref 1.7–2.4)
Magnesium: 1.8 mg/dL (ref 1.7–2.4)

## 2020-08-02 LAB — GAMMA GT: GGT: 550 U/L — ABNORMAL HIGH (ref 7–50)

## 2020-08-02 LAB — PREALBUMIN: Prealbumin: 9.1 mg/dL — ABNORMAL LOW (ref 18–38)

## 2020-08-02 LAB — GLUCOSE, CAPILLARY
Glucose-Capillary: 179 mg/dL — ABNORMAL HIGH (ref 70–99)
Glucose-Capillary: 186 mg/dL — ABNORMAL HIGH (ref 70–99)
Glucose-Capillary: 166 mg/dL — ABNORMAL HIGH (ref 70–99)

## 2020-08-02 LAB — OSMOLALITY: Osmolality: 281 mOsm/kg (ref 275–295)

## 2020-08-02 LAB — BILIRUBIN, DIRECT: Bilirubin, Direct: 11.4 mg/dL — ABNORMAL HIGH (ref 0.0–0.2)

## 2020-08-02 LAB — PROTIME-INR
INR: 1.1 (ref 0.8–1.2)
Prothrombin Time: 13.7 seconds (ref 11.4–15.2)

## 2020-08-02 LAB — TSH: TSH: 12.769 u[IU]/mL — ABNORMAL HIGH (ref 0.350–4.500)

## 2020-08-02 MED ORDER — GADOBUTROL 1 MMOL/ML IV SOLN
4.0000 mL | Freq: Once | INTRAVENOUS | Status: AC | PRN
  Administered 2020-08-02: 4 mL via INTRAVENOUS
  Filled 2020-08-02: qty 4

## 2020-08-02 MED ORDER — ONDANSETRON HCL 4 MG/2ML IJ SOLN: 4.0000 mg | Freq: Four times a day (QID) | INTRAMUSCULAR | Status: DC | PRN

## 2020-08-02 MED ORDER — POTASSIUM CHLORIDE 10 MEQ/100ML IV SOLN
10.0000 meq | INTRAVENOUS | Status: AC
  Administered 2020-08-02 (×4): 10 meq via INTRAVENOUS
  Filled 2020-08-02 (×4): qty 100

## 2020-08-02 MED ORDER — DONEPEZIL HCL 5 MG PO TABS
10.0000 mg | ORAL_TABLET | Freq: Every day | ORAL | Status: DC
  Administered 2020-08-02 – 2020-08-05 (×3): 10 mg via ORAL
  Filled 2020-08-02 (×4): qty 2

## 2020-08-02 MED ORDER — INSULIN ASPART 100 UNIT/ML ~~LOC~~ SOLN
0.0000 [IU] | Freq: Four times a day (QID) | SUBCUTANEOUS | Status: DC
  Administered 2020-08-02 (×3): 2 [IU] via SUBCUTANEOUS
  Administered 2020-08-03: 9 [IU] via SUBCUTANEOUS
  Administered 2020-08-03: 5 [IU] via SUBCUTANEOUS
  Administered 2020-08-04 (×2): 3 [IU] via SUBCUTANEOUS
  Administered 2020-08-04: 2 [IU] via SUBCUTANEOUS
  Administered 2020-08-04: 3 [IU] via SUBCUTANEOUS
  Administered 2020-08-05: 1 [IU] via SUBCUTANEOUS
  Filled 2020-08-02 (×10): qty 1

## 2020-08-02 MED ORDER — LEVOTHYROXINE SODIUM 50 MCG PO TABS
25.0000 ug | ORAL_TABLET | Freq: Every day | ORAL | Status: DC
  Administered 2020-08-02 – 2020-08-04 (×3): 25 ug via ORAL
  Filled 2020-08-02 (×2): qty 1

## 2020-08-02 NOTE — Progress Notes (Signed)
Initial Nutrition Assessment  DOCUMENTATION CODES:   Severe malnutrition in context of chronic illness  INTERVENTION:   RD will monitor for diet advancement and add supplements when warranted  Pt at high refeed risk  NUTRITION DIAGNOSIS:   Severe Malnutrition related to chronic illness (pancreatic mass, dementia) as evidenced by severe muscle depletion,severe fat depletion.  GOAL:   Patient will meet greater than or equal to 90% of their needs  MONITOR:   Labs,Weight trends,Skin,I & O's,Diet advancement  REASON FOR ASSESSMENT:   Consult Assessment of nutrition requirement/status  ASSESSMENT:   75 y.o. female with medical history significant for type 2 diabetes mellitus with most recent hemoglobin A1c 11.8% in December 2021, Alzheimer's dementia, hyperlipidemia, acquired hypothyroidism and hypertension who is admitted to [facility] on 08/19/2020 with obstructive jaundice in the setting of newly identified pancreatic mass   Visited pt's room today. Pt unable to provide any history r/t dementia. No family at bedside. Pt currently NPO. RD will monitor for diet advancement and add supplements when appropriate. GI consult pending. Per chart, pt down 23lbs(21%) over the past year; this is significant weight loss.   Medications reviewed and include: insulin, synthroid, LRS @50ml /hr  Labs reviewed: Na 129(L), Mg 1.8 wnl, alk phos 654(H), AST 139(H), tbili 18.4(H), pre-albumin 9.1(L) cbgs- 179, 186 x 24 hrs AIC 11.8(H)- 12/10  NUTRITION - FOCUSED PHYSICAL EXAM:  Flowsheet Row Most Recent Value  Orbital Region Severe depletion  Upper Arm Region Severe depletion  Thoracic and Lumbar Region Severe depletion  Buccal Region Moderate depletion  Temple Region Severe depletion  Clavicle Bone Region Severe depletion  Clavicle and Acromion Bone Region Severe depletion  Scapular Bone Region Severe depletion  Dorsal Hand Severe depletion  Patellar Region Severe  depletion  Anterior Thigh Region Severe depletion  Posterior Calf Region Severe depletion  Edema (RD Assessment) None  Hair Reviewed  Eyes Reviewed  Mouth Reviewed  Skin Reviewed  [jaundice]  Nails Reviewed     Diet Order:   Diet Order            Diet NPO time specified Except for: Ice Chips, Sips with Meds  Diet effective now                EDUCATION NEEDS:   Not appropriate for education at this time  Skin:  Skin Assessment: Reviewed RN Assessment (ecchymosis)  Last BM:  1/5- type 6  Height:   Ht Readings from Last 1 Encounters:  08/02/20 5' 1"  (1.549 m)    Weight:   Wt Readings from Last 1 Encounters:  08/02/20 39.6 kg    Ideal Body Weight:  47.7 kg  BMI:  Body mass index is 16.5 kg/m.  Estimated Nutritional Needs:   Kcal:  1200-1400kcal/day  Protein:  60-70g/day  Fluid:  1.0-1.2L/day  Koleen Distance MS, RD, LDN Please refer to [facility] for RD and/or RD on-call/weekend/after hours pager

## 2020-08-02 NOTE — Consult Note (Signed)
GI Inpatient Consult Note  Reason for Consult: Obstructive jaundice, pancreatic mass, intrahepatic and extrahepatic ductal dilatation   Attending Requesting Consult: Dr. Eugenia Pancoast  History of Present Illness: Deborah Thornton is a 75 y.o. female seen for evaluation of obstructive jaundice 2/2 pancreatic mass and intrahepatic and extrahepatic biliary dilatation at the request of Dr. Mal Misty. Pt has a PMH of Alzheimer's dementia, hypothyroidism, T2DM, HLD, and HTN. Pt was seen yesterday in outpatient setting by OBGYN Dr. Ouida Sills and was noted to be severely jaundiced and was advised to go to the [facility] ED for immediate evaluation. Upon presentation to the ED, she had transaminitis and evidence of cholestasis with AST 207, ALT 241, alk phos 817, and total bilirubin 22.5. Lipase was within normal limits. Patient had RUQ showed gallbladder sludge and cholelithiasis with no evidence for cholecystitis with intrahepatic and extrahepatic biliary dilatation. Subsequent contrasted CT abd/pelvis showed soft tissue mass within the pancreatic head concerning for pancreatic neoplasm with marked severity, predominant central intrahepatic biliary dilatation as well as dilatation of common bile duct and pancreatic duct. 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 No definitive metastatic disease identified in the abdomen.   Patient seen and examined today resting comfortable in hospital bed. Patient is alert to self, but unable to tell me where she is or who the president is. She is unable to give any history information. Patient's daughter-in-law is present and reports she has noticed the patient progressively declining over the last 9 months. She reports she has noticed poor PO intake, decreased appetite, and unintentional weight loss. She  reports she has noticed increased yellowing to patient's skin over the past month, but isn't totally sure how long this has been going on. She reports patient is confused most of the time and quality of life is not good. She is requesting for [place] to consult patient's ex-husband who takes care of patient.     Past Medical History:  Past Medical History:  Diagnosis Date  . Dementia ([place])   . Diabetes mellitus without complication [facility])     Problem List: Patient Active Problem List   Diagnosis Date Noted  . Pancreatic mass 08/02/2020  . Acute hyponatremia 08/02/2020  . Hypokalemia 08/02/2020  . Acquired hypothyroidism 08/02/2020  . Obstructive jaundice 08/11/2020  . Diabetic ketoacidosis associated with type 2 diabetes mellitus ([place]) 07/06/2020  . Hyperlipidemia associated with type 2 diabetes mellitus ([place]) 07/06/2020  . Essential hypertension 07/06/2020  . Hypothyroidism 07/06/2020  . Alzheimer's dementia without behavioral disturbance ([place]) 04/09/2017    Past Surgical History: Past Surgical History:  Procedure Laterality Date  . TONSILLECTOMY AND ADENOIDECTOMY  1954    Allergies: Allergies  Allergen Reactions  . Penicillin G Other (See Comments)  . Rofecoxib Other (See Comments)  . Sulfa Antibiotics Other (See Comments)    Home Medications: Medications Prior to Admission  Medication Sig Dispense Refill Last Dose  . aspirin EC 81 MG tablet Take 81 mg by mouth daily. Swallow whole.   08/07/2020 at 0600  . cholecalciferol (VITAMIN D) 25 MCG (1000 UNIT) tablet Take 2,000 Units by mouth daily.   08/10/2020 at 0600  . diltiazem (TIAZAC) 120 MG 24 hr capsule Take 120 mg by mouth daily.   08/15/2020 at 0600  . donepezil (ARICEPT) 10 MG tablet Take 10 mg by mouth daily.   08/18/2020  at 0600  . insulin aspart (NOVOLOG) 100 UNIT/ML injection Inject 3 Units into the skin 3 (three) times daily with meals. 10 mL 0 07/31/2020 at Unknown time  . insulin glargine (LANTUS) 100 UNIT/ML injection Inject  0.15 mLs (15 Units total) into the skin daily. 10 mL 0 07/31/2020 at Unknown time  . levothyroxine (SYNTHROID) 25 MCG tablet Take 25 mcg by mouth daily before breakfast.   08/27/2020 at 0600  . lisinopril (ZESTRIL) 10 MG tablet Take 10 mg by mouth in the morning and at bedtime.   08/22/2020 at 0600  . metFORMIN (GLUCOPHAGE) 500 MG tablet Take 500 mg by mouth 3 (three) times daily.   08/23/2020 at 0600  . Multiple Vitamins-Minerals (MULTIVITAMIN WITH MINERALS) tablet Take 1 tablet by mouth daily.   08/09/2020 at 0600  . simvastatin (ZOCOR) 40 MG tablet Take 40 mg by mouth daily.   08/28/2020 at 0600   Home medication reconciliation was completed with the patient.   Scheduled Inpatient Medications:   . donepezil  10 mg Oral Daily  . insulin aspart  0-9 Units Subcutaneous Q6H  . levothyroxine  25 mcg Oral QAC breakfast    Continuous Inpatient Infusions:   . lactated ringers 50 mL/hr at 08/02/20 1407    PRN Inpatient Medications:  ondansetron (ZOFRAN) IV  Family History: family history includes Cancer in her mother.  The patient's family history is negative for inflammatory bowel disorders, GI malignancy, or solid organ transplantation.  Social History:   reports that she has never smoked. She has never used smokeless tobacco. She reports that she does not drink alcohol and does not use drugs. The patient denies ETOH, tobacco, or drug use.   Review of Systems:  Unable to obtain due to patient's mental status   Physical Examination: BP (!) 147/79 (BP Location: Left Arm)   Pulse 73   Temp 98.3 F (36.8 C)   Resp 19   Ht _0  (1.549 m)   Wt 39.6 kg   SpO2 100%   BMI 16.50 kg/m   Elderly appearing female in hospital bed, pleasantly confused. Cachetic appearing.  Gen: NAD, alert but disoriented to time and place HEENT: PEERLA, EOMI, +icterus  Neck: supple, no JVD or thyromegaly Chest: CTA bilaterally, no wheezes, crackles, or other adventitious sounds CV: RRR, no m/g/c/r Abd: soft, NT,  ND, +BS in all four quadrants; no HSM, guarding, ridigity, or rebound tenderness Ext: no edema, well perfused with 2+ pulses, Skin: no rash or lesions noted Lymph: no LAD  Data: Lab Results  Component Value Date   WBC 6.6 08/02/2020   HGB 14.1 08/02/2020   HCT 40.6 08/02/2020   MCV 91.9 08/02/2020   PLT 198 08/02/2020   Recent Labs  Lab 07/29/2020 1451 08/02/20 0730  HGB 13.7  13.6 14.1   Lab Results  Component Value Date   NA 129 (L) 08/02/2020   K 4.8 08/02/2020   CL 94 (L) 08/02/2020   CO2 23 08/02/2020   BUN 13 08/02/2020   CREATININE RESULTS UNAVAILABLE DUE TO INTERFERING SUBSTANCE 08/02/2020   Lab Results  Component Value Date   ALT RESULTS UNAVAILABLE DUE TO INTERFERING SUBSTANCE 08/02/2020   AST 139 (H) 08/02/2020   GGT 550 (H) 08/02/2020   ALKPHOS 654 (H) 08/02/2020   BILITOT 18.4 (HH) 08/02/2020   Recent Labs  Lab 08/02/20 0730  INR 1.1   RUQ [place] 08/14/2020: IMPRESSION: Gallbladder sludge and gallstones.  Extrahepatic and intrahepatic biliary ductal dilatation. Given the clinical findings, CT  of the abdomen and pelvis with contrast is recommended for further evaluation.  CT abd/pelvis with contrast 08/12/2020: IMPRESSION: 1. Soft tissue mass within the pancreatic head which represents a new finding when compared to the prior study and is concerning for the presence of a pancreatic neoplasm. MRI correlation is recommended. 2. Marked severity, predominant central intrahepatic biliary dilatation, as well as dilatation of the common bile duct and pancreatic duct. 3. Cholelithiasis. 4. Mild amount of abdominal and pelvic free fluid. 5. Multiple predominantly subcentimeter splenic lesions, as described above. MRI correlation is recommended. Reference: J Am Coll Radiol 2013;10:833-839. 6. Colonic diverticulosis. 7. Aortic atherosclerosis.  Aortic Atherosclerosis (ICD10-I70.0).  MRI w/wo contrast 08/02/2020: IMPRESSION: 1. Aggressive appearing  lesion in the head of the pancreas measuring approximately 2.2 x 1.7 x 2.0 cm, highly concerning for primary pancreatic adenocarcinoma. This is associated with obstruction of the common bile duct as well as the distal pancreatic duct. This appears to be associated with some acute pancreatitis at this time.This lesion is intimately associated with the superior mesenteric vein, splenoportal confluence and splenic vein, but no definite arterial involvement at this time. Further evaluation with endoscopic ultrasound and consideration for biopsy is recommended. 2. No definitive metastatic disease identified in the abdomen.  Assessment/Plan:  75 y/o Caucasian female with a PMH of Alzheimer's dementia, hypothyroidism, T2DM, HLD, and HTN presented to the ED last night for painless obstructive jaundice  1. 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   2. Elevated LFTs - transaminitis with AST 207, ALT 241 and severe cholestasis with alkaline phosphatase 817, tbili 22.5  3. Alzheimer's dementia   4. Hypotension - holding home anti-hypertensive regimen  5. Uncontrolled T2DM - Hgb A1c 11.8% last month  COVID-19 Test: NEGATIVE  Recommendations:  - Given clinical presentation along with lab w/u showing severe cholestasis and imaging showing pancreatic mass causing obstruction of CBD and distal pancreatic duct, there is concern for pancreatic adenocarcinoma. - It is advised to have ERCP as next step for stenting to relieve obstruction and possible brushings to confirm malignancy - Will check CA 19-9 - Dr. Alice Reichert discussed with Dr. Allen Norris and tentative plan is for ERCP on Friday - Continue supportive care - Following along with you   Discussed plan of care with patient's ex-husband (Bob Thornton) who patient lives with and he agrees to above  plan of care.   I reviewed the risks (including bleeding, perforation, infection, anesthesia complications, cardiac/respiratory complications, and post-ERCP pancreatitis), benefits and alternatives of ERCP. Patient's ex-husband consents to proceed.    Thank you for the consult. Please call with questions or concerns.  [facility] [phone] (Cell)

## 2020-08-02 NOTE — Progress Notes (Signed)
Patient's daughter asked for updates to be called to patient's husband [name]) first. His number is [phone]. If he can not be reached call patient's daughter [name]) at [phone].

## 2020-08-02 NOTE — Progress Notes (Signed)
  Update: I have consulted the on-call gastroenterologist, Dr.Tahiliani, for assistance with evaluation and management of obstructive jaundice as well as new diagnosis of mass in the head of the pancreas.   Newton Pigg, DO Hospitalist

## 2020-08-02 NOTE — ED Notes (Signed)
Pt to room ED38 from radiology. Pt placed on monitor, and given purewick. Pt resting comfortably in bed. Verbalizes no needs at this time.

## 2020-08-02 NOTE — ED Notes (Signed)
Pt transported to Xray. 

## 2020-08-02 NOTE — ED Notes (Signed)
Pt returned from MRI. Pt in NAD at this time. Awaiting further orders. Will continue to monitor.

## 2020-08-02 NOTE — ED Notes (Signed)
Took over care of pt. Pt currently in MRI. Will assess pt when pt returns to room.

## 2020-08-02 NOTE — Progress Notes (Addendum)
Progress Note    Deborah Thornton  [id number] DOB: 09/16/1945  DOA: 08/15/2020 PCP: Pcp, No      Brief Narrative:    Medical records reviewed and are as summarized below:  Deborah Thornton is a 75 y.o. female female with medical history significant for type 2 diabetes mellitus with most recent hemoglobin A1c 11.8% in December 2021, Alzheimer's dementia, hyperlipidemia, acquired hypothyroidism, hypertension, who was referred by her PCP to the emergency department because of jaundice and low blood pressure.      Assessment/Plan:   Principal Problem:   Obstructive jaundice Active Problems:   Essential hypertension   Pancreatic mass   Acute hyponatremia   Hypokalemia   Acquired hypothyroidism    Body mass index is 16.5 kg/m.  (Underweight), she looks malnourished.  Consult dietitian for further evaluation.    Pancreatic mass with elevated liver enzymes and obstructive jaundice: Gastroenterologist has been consulted to assist with management.  Hypotension: BP is better but is still low.  She was taking lisinopril and Cardizem at home but these have been held.  Continue IV fluids for hydration.  Hyponatremia: Etiology unclear but it may be due to dehydration.  Continue IV fluids and monitor sodium level.  Hypokalemia: Improved.  Type II DM: Hemoglobin A1c was 11.8 in December 2021.  Lantus and scheduled Humalog have been held because of poor oral intake.  Use Humalog as needed for hyperglycemia.  Alzheimer's dementia: Continue supportive care  Hypothyroidism: Continue Synthroid.   Diet Order            Diet NPO time specified Except for: Ice Chips, Sips with Meds  Diet effective now                    Consultants:  Gastroenterologist  Procedures:  None    Medications:   . donepezil  10 mg Oral Daily  . insulin aspart  0-9 Units Subcutaneous Q6H  . levothyroxine  25 mcg Oral QAC breakfast   Continuous Infusions: . lactated ringers 50  mL/hr at 08/02/20 0300     Anti-infectives (From admission, onward)   None             Family Communication/Anticipated D/C date and plan/Code Status   DVT prophylaxis: SCDs Start: 08/02/20 0035     Code Status: DNR  Family Communication: None Disposition Plan:    Status is: Inpatient  Remains inpatient appropriate because:Altered mental status and Ongoing diagnostic testing needed not appropriate for outpatient work up   Dispo: The patient is from: Home              Anticipated d/c is to: Home              Anticipated d/c date is: 1 day              Patient currently is not medically stable to d/c.           Subjective:   Interval events noted.  Patient is confused unable to provide any history.  Objective:    Vitals:   08/02/20 0400 08/02/20 0432 08/02/20 0500 08/02/20 0800  BP: 92/63 104/69  (!) 117/59  Pulse: 70 62  72  Resp: (!) 25 18  16   Temp:  97.6 F (36.4 C)  (!) 97.5 F (36.4 C)  TempSrc:  Oral  Oral  SpO2: 99% 100%  98%  Weight:   39.6 kg    No data found.  No intake or  output data in the 24 hours ending 08/02/20 0959 Filed Weights   07/31/2020 1401 08/02/20 0500  Weight: 40.8 kg 39.6 kg    Exam:  GEN: NAD, cachetic SKIN: Jaundice. Poor skin turgor EYES: EOMI. Icteric but no pallor ENT: MMM CV: RRR PULM: CTA B ABD: soft, ND, NT, +BS CNS: AAO x 1 (person) non focal EXT: B/l pedal edema. No tenderness   Data Reviewed:   I have personally reviewed following labs and imaging studies:  Labs: Labs show the following:   Basic Metabolic Panel: Recent Labs  Lab 08/05/2020 1451 08/02/20 0730  NA 129* 129*  K 3.4* 4.8  CL 89* 94*  CO2 24 23  GLUCOSE 204* 199*  BUN 15 13  CREATININE <0.30* RESULTS UNAVAILABLE DUE TO INTERFERING SUBSTANCE  CALCIUM 9.4 9.0  MG  --  1.8  1.9   GFR CrCl cannot be calculated (This lab value cannot be used to calculate CrCl because it is not a number: RESULTS UNAVAILABLE DUE TO  INTERFERING SUBSTANCE). Liver Function Tests: Recent Labs  Lab 08/26/2020 1451 08/02/20 0730  AST 207* 139*  ALT 241* RESULTS UNAVAILABLE DUE TO INTERFERING SUBSTANCE  ALKPHOS 817* 654*  BILITOT 22.5* 18.4*  PROT 7.3 5.9*  ALBUMIN 3.3* 2.5*   Recent Labs  Lab 07/30/2020 1451  LIPASE 29   No results for input(s): AMMONIA in the last 168 hours. Coagulation profile Recent Labs  Lab 08/02/20 0730  INR 1.1    CBC: Recent Labs  Lab 08/27/2020 1451 08/02/20 0730  WBC 7.3  7.2 6.6  NEUTROABS 6.1  --   HGB 13.7  13.6 14.1  HCT 40.1  40.1 40.6  MCV 93.5  93.7 91.9  PLT 195  187 198   Cardiac Enzymes: No results for input(s): CKTOTAL, CKMB, CKMBINDEX, TROPONINI in the last 168 hours. BNP (last 3 results) No results for input(s): PROBNP in the last 8760 hours. CBG: Recent Labs  Lab 08/12/2020 1408 08/02/20 0452  GLUCAP 186* 179*   D-Dimer: No results for input(s): DDIMER in the last 72 hours. Hgb A1c: No results for input(s): HGBA1C in the last 72 hours. Lipid Profile: No results for input(s): CHOL, HDL, LDLCALC, TRIG, CHOLHDL, LDLDIRECT in the last 72 hours. Thyroid function studies: Recent Labs    08/02/20 0730  TSH 12.769*   Anemia work up: No results for input(s): VITAMINB12, FOLATE, FERRITIN, TIBC, IRON, RETICCTPCT in the last 72 hours. Sepsis Labs: Recent Labs  Lab 08/06/2020 1451 08/02/20 0730  WBC 7.3  7.2 6.6    Microbiology Recent Results (from the past 240 hour(s))  Resp Panel by RT-PCR (Flu A&B, Covid) Nasopharyngeal Swab     Status: None   Collection Time: 07/31/2020 10:04 PM   Specimen: Nasopharyngeal Swab; Nasopharyngeal(NP) swabs in vial transport medium  Result Value Ref Range Status   SARS Coronavirus 2 by RT PCR NEGATIVE NEGATIVE Final    Comment: (NOTE) SARS-CoV-2 target nucleic acids are NOT DETECTED.  The SARS-CoV-2 RNA is generally detectable in upper respiratory specimens during the acute phase of infection. The  lowest concentration of SARS-CoV-2 viral copies this assay can detect is 138 copies/mL. A negative result does not preclude SARS-Cov-2 infection and should not be used as the sole basis for treatment or other patient management decisions. A negative result may occur with  improper specimen collection/handling, submission of specimen other than nasopharyngeal swab, presence of viral mutation(s) within the areas targeted by this assay, and inadequate number of viral copies(<138 copies/mL). A negative result  must be combined with clinical observations, patient history, and epidemiological information. The expected result is Negative.  Fact Sheet for Patients:  BloggerCourse.com  Fact Sheet for Healthcare Providers:  SeriousBroker.it  This test is no t yet approved or cleared by the [place] FDA and  has been authorized for detection and/or diagnosis of SARS-CoV-2 by FDA under an Emergency Use Authorization (EUA). This EUA will remain  in effect (meaning this test can be used) for the duration of the COVID-19 declaration under Section 564(b)(1) of the Act, 21 U.S.C.section 360bbb-3(b)(1), unless the authorization is terminated  or revoked sooner.       Influenza A by PCR NEGATIVE NEGATIVE Final   Influenza B by PCR NEGATIVE NEGATIVE Final    Comment: (NOTE) The Xpert Xpress SARS-CoV-2/FLU/RSV plus assay is intended as an aid in the diagnosis of influenza from Nasopharyngeal swab specimens and should not be used as a sole basis for treatment. Nasal washings and aspirates are unacceptable for Xpert Xpress SARS-CoV-2/FLU/RSV testing.  Fact Sheet for Patients: BloggerCourse.com  Fact Sheet for Healthcare Providers: SeriousBroker.it  This test is not yet approved or cleared by the [place] FDA and has been authorized for detection and/or diagnosis of SARS-CoV-2 by FDA under  an Emergency Use Authorization (EUA). This EUA will remain in effect (meaning this test can be used) for the duration of the COVID-19 declaration under Section 564(b)(1) of the Act, 21 U.S.C. section 360bbb-3(b)(1), unless the authorization is terminated or revoked.  Performed at [facility], [address] [place], [place] [address]     Procedures and diagnostic studies:  DG Skull 1-3 Views  Result Date: 08/02/2020 CLINICAL DATA:  MRI clearance, altered mental status EXAM: SKULL - 1-3 VIEW COMPARISON:  None. FINDINGS: No unexpected retained radiopaque metallic foreign body seen within the calvarium and within the facial structures. The paranasal sinuses are clear. No definite facial fracture. IMPRESSION: No unexpected retained metallic foreign body. Electronically Signed   By: Helyn Numbers MD   On: 08/02/2020 01:51   DG Chest 1 View  Result Date: 08/02/2020 CLINICAL DATA:  Pancreatic mass, screening for MRI EXAM: CHEST  1 VIEW COMPARISON:  07/06/2020 FINDINGS: Single frontal view of the chest demonstrates an unremarkable cardiac silhouette. No airspace disease, effusion, or pneumothorax. No acute bony abnormalities. No radiopaque foreign bodies from the base of the neck through the upper abdomen. IMPRESSION: 1. No acute intrathoracic process. Electronically Signed   By: Sharlet Salina M.D.   On: 08/02/2020 01:49   MR ABDOMEN W WO CONTRAST  Result Date: 08/02/2020 CLINICAL DATA:  75 year old female with history of soft tissue mass in the pancreatic head noted on prior CT examination, concerning for pancreatic neoplasm. Follow-up study. EXAM: MRI ABDOMEN WITHOUT AND WITH CONTRAST TECHNIQUE: Multiplanar multisequence MR imaging of the abdomen was performed both before and after the administration of intravenous contrast. CONTRAST:  40mL GADAVIST GADOBUTROL 1 MMOL/ML IV SOLN COMPARISON:  CT the abdomen and pelvis 18-Aug-2020. FINDINGS: Comment: Portions of today's examination are  significantly limited by extensive patient respiratory motion. Lower chest: Unremarkable. Hepatobiliary: No discrete cystic or solid hepatic lesions. Severe intra and extrahepatic biliary ductal dilatation. Common bile duct measures 1.6 cm in diameter in the porta hepatis. Gallbladder is severely distended. Gallbladder wall does not appear thickened. There are several small filling defects within the dependent portion of the gallbladder, compatible with tiny gallstones. No definite filling defect in the common bile duct to suggest choledocholithiasis. However, there is abrupt truncation of the  common bile duct distally related to a apparent mass in the head of the pancreas (discussed below). Pancreas: In the head of the pancreas there is a poorly defined mass which is T1 isointense, slightly T2 hyperintense, and hypovascular on post gadolinium imaging measuring approximately 2.2 x 1.7 x 2.0 cm (axial image 50 of series 27 and coronal image 38 of series 31). This is causing mass effect upon adjacent structures, most notably causing complete or near complete obstruction of the common bile duct which is markedly dilated proximally. In addition, MRCP images demonstrate severe dilatation of the pancreatic duct which measures up to 6 mm in diameter, as well as extensive side branch ectasia throughout the pancreas. This lesion also demonstrates diffusion restriction on diffusion-weighted images. This lesion comes in close proximity 2 but appears separate from the superior mesenteric artery, as there appears to be an intact intervening fat plane. However, the lesion comes in contact with and significantly narrows the superior mesenteric vein, spleno portal vein and splenic vein. Portal vein is patent. Celiac axis and common hepatic artery appear separate from the lesion. Small amount of peripancreatic T2 signal intensity suggestive of acute pancreatitis. No well-defined peripancreatic fluid collections to suggest pseudocyst  at this time. Spleen: Multiple subcentimeter T1 hypointense, T2 hyperintense nonenhancing or hypovascular lesions scattered throughout the spleen, difficult to characterize (likely to represent a combination of tiny cysts and hemangiomas). Adrenals/Urinary Tract: Subcentimeter T1 hypointense, T2 hyperintense nonenhancing lesion in the interpolar region of the left kidney is compatible with a small simple cyst. Right kidney and bilateral adrenal glands are normal in appearance. No hydroureteronephrosis in the visualized portions of the abdomen. Stomach/Bowel: Visualized portions are unremarkable. Vascular/Lymphatic: Aortic atherosclerosis. Vascular findings pertinent to probable pancreatic malignancy, as detailed above. No definite lymphadenopathy confidently identified in the abdomen on today's motion limited examination. Other: Trace volume of ascites in the pericolic gutters. Retroperitoneal edema, presumably reflective of pancreatitis. Musculoskeletal: No aggressive appearing osseous lesions are noted in the visualized portions of the skeleton. IMPRESSION: 1. 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 This is associated with obstruction of the common bile duct as well as the distal pancreatic duct. This appears to be associated with some acute pancreatitis at this time. This lesion is intimately associated with the superior mesenteric vein, splenoportal confluence and splenic vein, but no definite arterial involvement at this time. Further evaluation with endoscopic ultrasound and consideration for biopsy is recommended. 2. No definitive metastatic disease identified in the abdomen. Electronically Signed   By: Vinnie Langton M.D.   On: 08/02/2020 06:55   CT ABDOMEN PELVIS W CONTRAST  Result Date: 08/22/2020 CLINICAL DATA:  Hypotension and jaundice. EXAM: CT ABDOMEN AND PELVIS WITH CONTRAST  TECHNIQUE: Multidetector CT imaging of the abdomen and pelvis was performed using the standard protocol following bolus administration of intravenous contrast. CONTRAST:  3mL OMNIPAQUE IOHEXOL 300 MG/ML  SOLN COMPARISON:  August 10, 2007 FINDINGS: Lower chest: A small pericardial effusion is seen. Hepatobiliary: No focal liver abnormality is seen. There is marked severity, predominant central intrahepatic biliary dilatation. Subcentimeter gallstones are seen within the dependent portion of a markedly distended gallbladder. The common bile duct is dilated and measures approximately 1.8 cm. Pancreas: A 2.7 cm x 1.9 cm x 2.1 cm low-attenuation soft tissue mass is seen within the pancreatic head. This represents a new finding when compared to the prior study. Dilatation of the pancreatic duct is  also noted (6.1 mm). Spleen: Multiple subcentimeter ill-defined foci of parenchymal low attenuation are seen scattered throughout the spleen. These areas are mildly increased in number when compared to the prior study. An additional 1.2 cm x 1.0 cm ill-defined low-attenuation splenic lesion is seen (axial CT image 17, CT series number 2). Adrenals/Urinary Tract: Adrenal glands are unremarkable. Kidneys are normal in size, without renal calculi or hydronephrosis. A 6 mm cystic appearing area is seen within the mid left kidney. Bladder is unremarkable. Stomach/Bowel: Stomach is within normal limits. The appendix is not clearly visualized. No evidence of bowel dilatation. Noninflamed diverticula are seen throughout the large bowel. Mild diffuse colonic wall thickening is seen, however, it should be noted that the majority of the large bowel is decompressed. Vascular/Lymphatic: Aortic atherosclerosis. No enlarged abdominal or pelvic lymph nodes. Reproductive: Uterus and bilateral adnexa are unremarkable. Other: No abdominal wall hernia or abnormality. A mild amount of abdominal and pelvic free fluid is seen. Musculoskeletal:  Degenerative changes seen throughout the lumbar spine. IMPRESSION: 1. Soft tissue mass within the pancreatic head which represents a new finding when compared to the prior study and is concerning for the presence of a pancreatic neoplasm. MRI correlation is recommended. 2. Marked severity, predominant central intrahepatic biliary dilatation, as well as dilatation of the common bile duct and pancreatic duct. 3. Cholelithiasis. 4. Mild amount of abdominal and pelvic free fluid. 5. Multiple predominantly subcentimeter splenic lesions, as described above. MRI correlation is recommended. Reference: J Am Coll Radiol 2013;10:833-839. 6. Colonic diverticulosis. 7. Aortic atherosclerosis. Aortic Atherosclerosis (ICD10-I70.0). Electronically Signed   By: Virgina Norfolk M.D.   On: 08/23/2020 19:49   US Abdomen Limited RUQ (LIVER/GB)  Result Date: 08/21/2020 CLINICAL DATA:  Jaundice and elevated LFTs EXAM: ULTRASOUND ABDOMEN LIMITED RIGHT UPPER QUADRANT COMPARISON:  None. FINDINGS: Gallbladder: Gallbladder is well distended with gallstones and gallbladder sludge within. No wall thickness or pericholecystic fluid is noted. Common bile duct: Diameter: 14 mm Liver: Intrahepatic biliary ductal dilatation is noted. Mild increased echogenicity is noted consistent with fatty infiltration. No mass lesion is seen. Portal vein is patent on color Doppler imaging with normal direction of blood flow towards the liver. Other: Imaging of the pancreas was performed given the dilatation of the common bile duct. There is prominence of the pancreatic duct is well although the head and uncinate process for not well visualized. IMPRESSION: Gallbladder sludge and gallstones. Extrahepatic and intrahepatic biliary ductal dilatation. Given the clinical findings, CT of the abdomen and pelvis with contrast is recommended for further evaluation. Electronically Signed   By: Inez Catalina M.D.   On: 07/31/2020 16:44               LOS: 1  day   Normagene Harvie  [facility]   Pager on [email or web address]. If 7PM-7AM, please contact night-coverage at [email or web address]     08/02/2020, 9:59 AM

## 2020-08-02 NOTE — ED Notes (Signed)
Secure message sent to receiving RN. Awaiting reply. Pt in NAD at this time.

## 2020-08-03 DIAGNOSIS — K8689 Other specified diseases of pancreas: Secondary | ICD-10-CM | POA: Diagnosis not present

## 2020-08-03 DIAGNOSIS — E43 Unspecified severe protein-calorie malnutrition: Secondary | ICD-10-CM

## 2020-08-03 DIAGNOSIS — E871 Hypo-osmolality and hyponatremia: Secondary | ICD-10-CM | POA: Diagnosis not present

## 2020-08-03 DIAGNOSIS — E039 Hypothyroidism, unspecified: Secondary | ICD-10-CM | POA: Diagnosis not present

## 2020-08-03 DIAGNOSIS — K831 Obstruction of bile duct: Secondary | ICD-10-CM | POA: Diagnosis not present

## 2020-08-03 LAB — COMPREHENSIVE METABOLIC PANEL
ALT: 147 U/L — ABNORMAL HIGH (ref 0–44)
AST: 113 U/L — ABNORMAL HIGH (ref 15–41)
Albumin: 2.3 g/dL — ABNORMAL LOW (ref 3.5–5.0)
Alkaline Phosphatase: 537 U/L — ABNORMAL HIGH (ref 38–126)
Anion gap: 14 (ref 5–15)
BUN: 11 mg/dL (ref 8–23)
CO2: 20 mmol/L — ABNORMAL LOW (ref 22–32)
Calcium: 8.8 mg/dL — ABNORMAL LOW (ref 8.9–10.3)
Chloride: 98 mmol/L (ref 98–111)
Creatinine, Ser: <0.3 mg/dL — ABNORMAL LOW (ref 0.44–1.00)
Glucose, Bld: 173 mg/dL — ABNORMAL HIGH (ref 70–99)
Potassium: 4.5 mmol/L (ref 3.5–5.1)
Sodium: 132 mmol/L — ABNORMAL LOW (ref 135–145)
Total Bilirubin: 16.6 mg/dL — ABNORMAL HIGH (ref 0.3–1.2)
Total Protein: 5.1 g/dL — ABNORMAL LOW (ref 6.5–8.1)

## 2020-08-03 LAB — GLUCOSE, CAPILLARY
Glucose-Capillary: 138 mg/dL — ABNORMAL HIGH (ref 70–99)
Glucose-Capillary: 291 mg/dL — ABNORMAL HIGH (ref 70–99)
Glucose-Capillary: 376 mg/dL — ABNORMAL HIGH (ref 70–99)

## 2020-08-03 LAB — T4, FREE: Free T4: 0.73 ng/dL (ref 0.61–1.12)

## 2020-08-03 MED ORDER — BOOST / RESOURCE BREEZE PO LIQD CUSTOM
1.0000 | Freq: Three times a day (TID) | ORAL | Status: DC
  Administered 2020-08-03 (×2): 1 via ORAL

## 2020-08-03 MED ORDER — NEPRO/CARBSTEADY PO LIQD
237.0000 mL | Freq: Two times a day (BID) | ORAL | Status: DC
  Administered 2020-08-05 – 2020-08-13 (×11): 237 mL via ORAL

## 2020-08-03 NOTE — Progress Notes (Signed)
   08/03/20 0816  Assess: MEWS Score  Temp 97.8 F (36.6 C)  Level of Consciousness Alert  Assess: MEWS Score  MEWS Temp 0  MEWS Systolic 1  MEWS Pulse 0  MEWS RR 1  MEWS LOC 0  MEWS Score 2  MEWS Score Color Yellow  Assess: if the MEWS score is Yellow or Red  Were vital signs taken at a resting state? Yes  Focused Assessment Change from prior assessment (see assessment flowsheet)  Early Detection of Sepsis Score *See Row Information* Low  MEWS guidelines implemented *See Row Information* Yes  Treat  MEWS Interventions Other (Comment) (no intervention)  Take Vital Signs  Increase Vital Sign Frequency  Yellow: Q 2hr X 2 then Q 4hr X 2, if remains yellow, continue Q 4hrs  Notify: Charge Nurse/RN  Name of Charge Nurse/RN Notified Cicero Duck H  Date Charge Nurse/RN Notified 08/03/20  Time Charge Nurse/RN Notified 1000  Notify: Provider  Provider Name/Title Dr Myriam Forehand  Date Provider Notified 08/03/20  Time Provider Notified 1605  Notification Type Page  Document  Patient Outcome Other (Comment);Stabilized after interventions

## 2020-08-03 NOTE — Plan of Care (Signed)
Pt confused, calm and responds mostly to her name. Follows commands. Not very talkative but obeys commands. Soft BP noted but stable with MAP> 65. Blood sugar q 6 hours, controlled. On RA with no signs of distress noted. Safety measures in place. Will continue to monitor.  Problem: Education: Goal: Knowledge of General Education information will improve Description: Including pain rating scale, medication(s)/side effects and non-pharmacologic comfort measures Outcome: Progressing   Problem: Health Behavior/Discharge Planning: Goal: Ability to manage health-related needs will improve Outcome: Progressing   Problem: Clinical Measurements: Goal: Ability to maintain clinical measurements within normal limits will improve Outcome: Progressing Goal: Will remain free from infection Outcome: Progressing Goal: Diagnostic test results will improve Outcome: Progressing Goal: Respiratory complications will improve Outcome: Progressing Goal: Cardiovascular complication will be avoided Outcome: Progressing   Problem: Activity: Goal: Risk for activity intolerance will decrease Outcome: Progressing   Problem: Nutrition: Goal: Adequate nutrition will be maintained Outcome: Progressing   Problem: Coping: Goal: Level of anxiety will decrease Outcome: Progressing   Problem: Elimination: Goal: Will not experience complications related to bowel motility Outcome: Progressing Goal: Will not experience complications related to urinary retention Outcome: Progressing   Problem: Pain Managment: Goal: General experience of comfort will improve Outcome: Progressing   Problem: Safety: Goal: Ability to remain free from injury will improve Outcome: Progressing   Problem: Skin Integrity: Goal: Risk for impaired skin integrity will decrease Outcome: Progressing

## 2020-08-03 NOTE — Progress Notes (Signed)
GI Inpatient Follow-up Note  Subjective:  Patient seen in follow-up for pancreatic mass with obstructive jaundice. No acute events overnight. Pt still confused. Pt is sitting up in her bed and repeating the phrase "I do not know what to do." Pt does respond to her name and follow commands.   Scheduled Inpatient Medications:  . donepezil  10 mg Oral Daily  . feeding supplement  1 Container Oral TID BM  . insulin aspart  0-9 Units Subcutaneous Q6H  . levothyroxine  25 mcg Oral QAC breakfast    Continuous Inpatient Infusions:   . lactated ringers 50 mL/hr at 08/02/20 2109    PRN Inpatient Medications:  ondansetron (ZOFRAN) IV  Review of Systems:  Unable to obtain due to patient's confusion and altered mental status    Physical Examination: BP 113/78 (BP Location: Left Arm)   Pulse 92   Temp 97.9 F (36.6 C)   Resp (!) 22   Ht 5\' 1"  (1.549 m)   Wt 39.6 kg   SpO2 100%   BMI 16.50 kg/m  Pleasantly confused, appears comfortable.  Gen: NAD, alert to self HEENT: PEERLA, EOMI, +sceral icterus Neck: supple, no JVD or thyromegaly Chest: CTA bilaterally, no wheezes, crackles, or other adventitious sounds CV: RRR, no m/g/c/r Abd: soft, NT, ND, +BS in all four quadrants; no HSM, guarding, ridigity, or rebound tenderness Ext: no edema, well perfused with 2+ pulses, Skin: no rash or lesions noted, ++jaundiced  Lymph: no LAD  Data: Lab Results  Component Value Date   WBC 6.6 08/02/2020   HGB 14.1 08/02/2020   HCT 40.6 08/02/2020   MCV 91.9 08/02/2020   PLT 198 08/02/2020   Recent Labs  Lab 08/29/2020 1451 08/02/20 0730  HGB 13.7  13.6 14.1   Lab Results  Component Value Date   NA 132 (L) 08/03/2020   K 4.5 08/03/2020   CL 98 08/03/2020   CO2 20 (L) 08/03/2020   BUN 11 08/03/2020   CREATININE <0.30 (L) 08/03/2020   Lab Results  Component Value Date   ALT 147 (H) 08/03/2020   AST 113 (H) 08/03/2020   GGT 550 (H) 08/02/2020   ALKPHOS 537 (H) 08/03/2020   BILITOT  16.6 (H) 08/03/2020   Recent Labs  Lab 08/02/20 0730  INR 1.1   RUQ 09/30/20 Aug 29, 2020: IMPRESSION: Gallbladder sludge and gallstones.  Extrahepatic and intrahepatic biliary ductal dilatation. Given the clinical findings, CT of the abdomen and pelvis with contrast is recommended for further evaluation.  CT abd/pelvis with contrast 08-29-2020: IMPRESSION: 1. Soft tissue mass within the pancreatic head which represents a new finding when compared to the prior study and is concerning for the presence of a pancreatic neoplasm. MRI correlation is recommended. 2. Marked severity, predominant central intrahepatic biliary dilatation, as well as dilatation of the common bile duct and pancreatic duct. 3. Cholelithiasis. 4. Mild amount of abdominal and pelvic free fluid. 5. Multiple predominantly subcentimeter splenic lesions, as described above. MRI correlation is recommended. Reference: J Am Coll Radiol 2013;10:833-839. 6. Colonic diverticulosis. 7. Aortic atherosclerosis.  Aortic Atherosclerosis (ICD10-I70.0).  MRI w/wo contrast 08/02/2020: IMPRESSION: 1. 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 This is associated with obstruction of the common bile duct as well as the distal pancreatic duct. This appears to be associated with some acute pancreatitis at this time.This lesion is intimately associated with the superior mesenteric vein, splenoportal confluence and splenic vein,  but no definite arterial involvement at this time. Further evaluation with endoscopic ultrasound and consideration for biopsy is recommended. 2. No definitive metastatic disease identified in the abdomen.  Assessment/Plan:  75 y/o Caucasian female with a PMH of Alzheimer's dementia, hypothyroidism, T2DM, HLD, and HTN admitted for painless, obstructive jaundice found to have pancreatic mass with  biliary obstruction  1. 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   2. Elevated LFTs - transaminitis with AST 207, ALT 241 and severe cholestasis with alkaline phosphatase 817, tbili 22.5  3. Alzheimer's dementia   4. Hypotension - holding home anti-hypertensive regimen  5. Uncontrolled T2DM - Hgb A1c 11.8% last month  COVID-19 Test: NEGATIVE  Recommendations:  - Plan for ERCP tomorrow with Dr. Allen Norris for stenting and possible brushings to confirm malignancy - CA 19-9 and AFP pending - After ERCP tomorrow, myself and Dr. Alice Reichert will follow the patient over the weekend and provide further recommendations  - See ERCP procedure note for findings  Discussed plan of care with patient's ex-husband (Bob Martinique) who patient lives with and he agrees to above plan of care.   I reviewed the risks (including bleeding, perforation, infection, anesthesia complications, cardiac/respiratory complications, and post-ERCP pancreatitis), benefits and alternatives of ERCP. Patient's ex-husband consents to proceed.   Please call with questions or concerns.    Octavia Bruckner, PA-C [facility] [phone] (Cell)

## 2020-08-03 NOTE — Progress Notes (Addendum)
Progress Note    Deborah Thornton  [id number] DOB: [birth year]  DOA: 08/11/2020 PCP: Pcp, No      Brief Narrative:    Medical records reviewed and are as summarized below:  Deborah Thornton is a 75 y.o. female female with medical history significant for type 2 diabetes mellitus with most recent hemoglobin A1c 11.8% in December 2021, Alzheimer's dementia, hyperlipidemia, acquired hypothyroidism, hypertension, who was referred by her PCP to the emergency department because of jaundice and low blood pressure.      Assessment/Plan:   Principal Problem:   Obstructive jaundice Active Problems:   Essential hypertension   Pancreatic mass   Acute hyponatremia   Hypokalemia   Acquired hypothyroidism   Protein-calorie malnutrition, severe    Body mass index is 16.5 kg/m.  Underweight, severe malnutrition, encourage adequate oral intake.  Follow-up with dietitian.    Pancreatic mass with elevated liver enzymes and obstructive jaundice: Keep n.p.o. after midnight.  Plan for ERCP tomorrow.  Follow-up with gastroenterologist.  Hypotension: BP still low.  Continue IV fluids.  Cardizem and lisinopril still on hold.    Hyponatremia: Sodium level is trending up.  Etiology unclear but it may be due to dehydration.  Continue IV fluids monitor sodium level.  Hypokalemia: Improved.  Type II DM: Hemoglobin A1c was 11.8 in December 2021.  Lantus and scheduled Humalog have been held because of poor oral intake.  Use Humalog as needed for hyperglycemia.  Alzheimer's dementia: Continue supportive care  Hypothyroidism: Free T4 is normal.  Free T3 is pending.  Continue Synthroid.   Diet Order            Diet NPO time specified  Diet effective midnight           Diet clear liquid Room service appropriate? Yes; Fluid consistency: Thin  Diet effective now                    Consultants:  Gastroenterologist  Procedures:  None    Medications:   . donepezil  10 mg  Oral Daily  . feeding supplement  1 Container Oral TID BM  . insulin aspart  0-9 Units Subcutaneous Q6H  . levothyroxine  25 mcg Oral QAC breakfast   Continuous Infusions: . lactated ringers 50 mL/hr at 08/02/20 2109     Anti-infectives (From admission, onward)   None             Family Communication/Anticipated D/C date and plan/Code Status   DVT prophylaxis: SCDs Start: 08/02/20 0035     Code Status: DNR  Family Communication: None Disposition Plan:    Status is: Inpatient  Remains inpatient appropriate because:Altered mental status and Ongoing diagnostic testing needed not appropriate for outpatient work up   Dispo: The patient is from: Home              Anticipated d/c is to: Home              Anticipated d/c date is: 3 days              Patient currently is not medically stable to d/c.           Subjective:   Interval events noted.  She is unable to provide any history because of underlying dementia  Objective:    Vitals:   08/03/20 0807 08/03/20 0816 08/03/20 1118 08/03/20 1523  BP: 93/63  113/78 95/63  Pulse: 83  92 90  Resp: (!) 22  Temp:  97.8 F (36.6 C) 97.9 F (36.6 C) 97.6 F (36.4 C)  TempSrc:      SpO2: 96%  100% 100%  Weight:      Height:       No data found.   Intake/Output Summary (Last 24 hours) at 08/03/2020 1608 Last data filed at 08/03/2020 0100 Gross per 24 hour  Intake 159.07 ml  Output 0 ml  Net 159.07 ml   Filed Weights   08/20/2020 1401 08/02/20 0500  Weight: 40.8 kg 39.6 kg    Exam:   GEN: NAD SKIN: Jaundice.  Was contacted. EYES: Icteric ENT: MMM CV: RRR PULM: CTA B ABD: soft, ND, NT, +BS CNS: Alert, oriented x1 (person), non focal EXT: Bilateral pedal edema.  No tenderness     Data Reviewed:   I have personally reviewed following labs and imaging studies:  Labs: Labs show the following:   Basic Metabolic Panel: Recent Labs  Lab 08/10/2020 1451 08/02/20 0730 08/03/20 0430  NA  129* 129* 132*  K 3.4* 4.8 4.5  CL 89* 94* 98  CO2 24 23 20*  GLUCOSE 204* 199* 173*  BUN 15 13 11   CREATININE <0.30* RESULTS UNAVAILABLE DUE TO INTERFERING SUBSTANCE <0.30*  CALCIUM 9.4 9.0 8.8*  MG  --  1.8  1.9  --    GFR CrCl cannot be calculated (This lab value cannot be used to calculate CrCl because it is not a number: <0.30). Liver Function Tests: Recent Labs  Lab 07/31/2020 1451 08/02/20 0730 08/03/20 0430  AST 207* 139* 113*  ALT 241* RESULTS UNAVAILABLE DUE TO INTERFERING SUBSTANCE 147*  ALKPHOS 817* 654* 537*  BILITOT 22.5* 18.4* 16.6*  PROT 7.3 5.9* 5.1*  ALBUMIN 3.3* 2.5* 2.3*   Recent Labs  Lab 08/05/2020 1451  LIPASE 29   No results for input(s): AMMONIA in the last 168 hours. Coagulation profile Recent Labs  Lab 08/02/20 0730  INR 1.1    CBC: Recent Labs  Lab 08/26/2020 1451 08/02/20 0730  WBC 7.3  7.2 6.6  NEUTROABS 6.1  --   HGB 13.7  13.6 14.1  HCT 40.1  40.1 40.6  MCV 93.5  93.7 91.9  PLT 195  187 198   Cardiac Enzymes: No results for input(s): CKTOTAL, CKMB, CKMBINDEX, TROPONINI in the last 168 hours. BNP (last 3 results) No results for input(s): PROBNP in the last 8760 hours. CBG: Recent Labs  Lab 08/02/20 0452 08/02/20 1157 08/02/20 1813 08/03/20 0115 08/03/20 1209  GLUCAP 179* 186* 166* 138* 291*   D-Dimer: No results for input(s): DDIMER in the last 72 hours. Hgb A1c: No results for input(s): HGBA1C in the last 72 hours. Lipid Profile: No results for input(s): CHOL, HDL, LDLCALC, TRIG, CHOLHDL, LDLDIRECT in the last 72 hours. Thyroid function studies: Recent Labs    08/02/20 0730  TSH 12.769*   Anemia work up: No results for input(s): VITAMINB12, FOLATE, FERRITIN, TIBC, IRON, RETICCTPCT in the last 72 hours. Sepsis Labs: Recent Labs  Lab 08/13/2020 1451 08/02/20 0730  WBC 7.3  7.2 6.6    Microbiology Recent Results (from the past 240 hour(s))  Resp Panel by RT-PCR (Flu A&B, Covid) Nasopharyngeal Swab      Status: None   Collection Time: 08/14/2020 10:04 PM   Specimen: Nasopharyngeal Swab; Nasopharyngeal(NP) swabs in vial transport medium  Result Value Ref Range Status   SARS Coronavirus 2 by RT PCR NEGATIVE NEGATIVE Final    Comment: (NOTE) SARS-CoV-2 target nucleic acids are NOT DETECTED.  The SARS-CoV-2 RNA is generally detectable in upper respiratory specimens during the acute phase of infection. The lowest concentration of SARS-CoV-2 viral copies this assay can detect is 138 copies/mL. A negative result does not preclude SARS-Cov-2 infection and should not be used as the sole basis for treatment or other patient management decisions. A negative result may occur with  improper specimen collection/handling, submission of specimen other than nasopharyngeal swab, presence of viral mutation(s) within the areas targeted by this assay, and inadequate number of viral copies(<138 copies/mL). A negative result must be combined with clinical observations, patient history, and epidemiological information. The expected result is Negative.  Fact Sheet for Patients:  EntrepreneurPulse.com.au  Fact Sheet for Healthcare Providers:  IncredibleEmployment.be  This test is no t yet approved or cleared by the [place] FDA and  has been authorized for detection and/or diagnosis of SARS-CoV-2 by FDA under an Emergency Use Authorization (EUA). This EUA will remain  in effect (meaning this test can be used) for the duration of the COVID-19 declaration under Section 564(b)(1) of the Act, 21 U.S.C.section 360bbb-3(b)(1), unless the authorization is terminated  or revoked sooner.       Influenza A by PCR NEGATIVE NEGATIVE Final   Influenza B by PCR NEGATIVE NEGATIVE Final    Comment: (NOTE) The Xpert Xpress SARS-CoV-2/FLU/RSV plus assay is intended as an aid in the diagnosis of influenza from Nasopharyngeal swab specimens and should not be used as a sole basis  for treatment. Nasal washings and aspirates are unacceptable for Xpert Xpress SARS-CoV-2/FLU/RSV testing.  Fact Sheet for Patients: EntrepreneurPulse.com.au  Fact Sheet for Healthcare Providers: IncredibleEmployment.be  This test is not yet approved or cleared by the [place] FDA and has been authorized for detection and/or diagnosis of SARS-CoV-2 by FDA under an Emergency Use Authorization (EUA). This EUA will remain in effect (meaning this test can be used) for the duration of the COVID-19 declaration under Section 564(b)(1) of the Act, 21 U.S.C. section 360bbb-3(b)(1), unless the authorization is terminated or revoked.  Performed at [facility], [address] [place], [place] [address]     Procedures and diagnostic studies:  DG Skull 1-3 Views  Result Date: 08/02/2020 CLINICAL DATA:  MRI clearance, altered mental status EXAM: SKULL - 1-3 VIEW COMPARISON:  None. FINDINGS: No unexpected retained radiopaque metallic foreign body seen within the calvarium and within the facial structures. The paranasal sinuses are clear. No definite facial fracture. IMPRESSION: No unexpected retained metallic foreign body. Electronically Signed   By: Fidela Salisbury MD   On: 08/02/2020 01:51   DG Chest 1 View  Result Date: 08/02/2020 CLINICAL DATA:  Pancreatic mass, screening for MRI EXAM: CHEST  1 VIEW COMPARISON:  07/06/2020 FINDINGS: Single frontal view of the chest demonstrates an unremarkable cardiac silhouette. No airspace disease, effusion, or pneumothorax. No acute bony abnormalities. No radiopaque foreign bodies from the base of the neck through the upper abdomen. IMPRESSION: 1. No acute intrathoracic process. Electronically Signed   By: Randa Ngo M.D.   On: 08/02/2020 01:49   MR ABDOMEN W WO CONTRAST  Result Date: 08/02/2020 CLINICAL DATA:  75 year old female with history of soft tissue mass in the pancreatic head noted on prior CT  examination, concerning for pancreatic neoplasm. Follow-up study. EXAM: MRI ABDOMEN WITHOUT AND WITH CONTRAST TECHNIQUE: Multiplanar multisequence MR imaging of the abdomen was performed both before and after the administration of intravenous contrast. CONTRAST:  58mL GADAVIST GADOBUTROL 1 MMOL/ML IV SOLN COMPARISON:  CT the abdomen and  pelvis 08/19/2020. FINDINGS: Comment: Portions of today's examination are significantly limited by extensive patient respiratory motion. Lower chest: Unremarkable. Hepatobiliary: No discrete cystic or solid hepatic lesions. Severe intra and extrahepatic biliary ductal dilatation. Common bile duct measures 1.6 cm in diameter in the porta hepatis. Gallbladder is severely distended. Gallbladder wall does not appear thickened. There are several small filling defects within the dependent portion of the gallbladder, compatible with tiny gallstones. No definite filling defect in the common bile duct to suggest choledocholithiasis. However, there is abrupt truncation of the common bile duct distally related to a apparent mass in the head of the pancreas (discussed below). Pancreas: In the head of the pancreas there is a poorly defined mass which is T1 isointense, slightly T2 hyperintense, and hypovascular on post gadolinium imaging measuring approximately 2.2 x 1.7 x 2.0 cm (axial image 50 of series 27 and coronal image 38 of series 31). This is causing mass effect upon adjacent structures, most notably causing complete or near complete obstruction of the common bile duct which is markedly dilated proximally. In addition, MRCP images demonstrate severe dilatation of the pancreatic duct which measures up to 6 mm in diameter, as well as extensive side branch ectasia throughout the pancreas. This lesion also demonstrates diffusion restriction on diffusion-weighted images. This lesion comes in close proximity 2 but appears separate from the superior mesenteric artery, as there appears to be an  intact intervening fat plane. However, the lesion comes in contact with and significantly narrows the superior mesenteric vein, spleno portal vein and splenic vein. Portal vein is patent. Celiac axis and common hepatic artery appear separate from the lesion. Small amount of peripancreatic T2 signal intensity suggestive of acute pancreatitis. No well-defined peripancreatic fluid collections to suggest pseudocyst at this time. Spleen: Multiple subcentimeter T1 hypointense, T2 hyperintense nonenhancing or hypovascular lesions scattered throughout the spleen, difficult to characterize (likely to represent a combination of tiny cysts and hemangiomas). Adrenals/Urinary Tract: Subcentimeter T1 hypointense, T2 hyperintense nonenhancing lesion in the interpolar region of the left kidney is compatible with a small simple cyst. Right kidney and bilateral adrenal glands are normal in appearance. No hydroureteronephrosis in the visualized portions of the abdomen. Stomach/Bowel: Visualized portions are unremarkable. Vascular/Lymphatic: Aortic atherosclerosis. Vascular findings pertinent to probable pancreatic malignancy, as detailed above. No definite lymphadenopathy confidently identified in the abdomen on today's motion limited examination. Other: Trace volume of ascites in the pericolic gutters. Retroperitoneal edema, presumably reflective of pancreatitis. Musculoskeletal: No aggressive appearing osseous lesions are noted in the visualized portions of the skeleton. IMPRESSION: 1. 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 This is associated with obstruction of the common bile duct as well as the distal pancreatic duct. This appears to be associated with some acute pancreatitis at this time. This lesion is intimately associated with the superior mesenteric vein, splenoportal confluence and splenic vein, but no  definite arterial involvement at this time. Further evaluation with endoscopic ultrasound and consideration for biopsy is recommended. 2. No definitive metastatic disease identified in the abdomen. Electronically Signed   By: Vinnie Langton M.D.   On: 08/02/2020 06:55   CT ABDOMEN PELVIS W CONTRAST  Result Date: 07/29/2020 CLINICAL DATA:  Hypotension and jaundice. EXAM: CT ABDOMEN AND PELVIS WITH CONTRAST TECHNIQUE: Multidetector CT imaging of the abdomen and pelvis was performed using the standard protocol following bolus administration of intravenous contrast. CONTRAST:  17mL OMNIPAQUE IOHEXOL 300 MG/ML  SOLN COMPARISON:  August 10, 2007 FINDINGS: Lower chest: A small pericardial effusion is seen. Hepatobiliary: No focal liver abnormality is seen. There is marked severity, predominant central intrahepatic biliary dilatation. Subcentimeter gallstones are seen within the dependent portion of a markedly distended gallbladder. The common bile duct is dilated and measures approximately 1.8 cm. Pancreas: A 2.7 cm x 1.9 cm x 2.1 cm low-attenuation soft tissue mass is seen within the pancreatic head. This represents a new finding when compared to the prior study. Dilatation of the pancreatic duct is also noted (6.1 mm). Spleen: Multiple subcentimeter ill-defined foci of parenchymal low attenuation are seen scattered throughout the spleen. These areas are mildly increased in number when compared to the prior study. An additional 1.2 cm x 1.0 cm ill-defined low-attenuation splenic lesion is seen (axial CT image 17, CT series number 2). Adrenals/Urinary Tract: Adrenal glands are unremarkable. Kidneys are normal in size, without renal calculi or hydronephrosis. A 6 mm cystic appearing area is seen within the mid left kidney. Bladder is unremarkable. Stomach/Bowel: Stomach is within normal limits. The appendix is not clearly visualized. No evidence of bowel dilatation. Noninflamed diverticula are seen throughout the  large bowel. Mild diffuse colonic wall thickening is seen, however, it should be noted that the majority of the large bowel is decompressed. Vascular/Lymphatic: Aortic atherosclerosis. No enlarged abdominal or pelvic lymph nodes. Reproductive: Uterus and bilateral adnexa are unremarkable. Other: No abdominal wall hernia or abnormality. A mild amount of abdominal and pelvic free fluid is seen. Musculoskeletal: Degenerative changes seen throughout the lumbar spine. IMPRESSION: 1. Soft tissue mass within the pancreatic head which represents a new finding when compared to the prior study and is concerning for the presence of a pancreatic neoplasm. MRI correlation is recommended. 2. Marked severity, predominant central intrahepatic biliary dilatation, as well as dilatation of the common bile duct and pancreatic duct. 3. Cholelithiasis. 4. Mild amount of abdominal and pelvic free fluid. 5. Multiple predominantly subcentimeter splenic lesions, as described above. MRI correlation is recommended. Reference: J Am Coll Radiol 2013;10:833-839. 6. Colonic diverticulosis. 7. Aortic atherosclerosis. Aortic Atherosclerosis (ICD10-I70.0). Electronically Signed   By: Aram Candela M.D.   On: 2020-08-15 19:49   US Abdomen Limited RUQ (LIVER/GB)  Result Date: Aug 15, 2020 CLINICAL DATA:  Jaundice and elevated LFTs EXAM: ULTRASOUND ABDOMEN LIMITED RIGHT UPPER QUADRANT COMPARISON:  None. FINDINGS: Gallbladder: Gallbladder is well distended with gallstones and gallbladder sludge within. No wall thickness or pericholecystic fluid is noted. Common bile duct: Diameter: 14 mm Liver: Intrahepatic biliary ductal dilatation is noted. Mild increased echogenicity is noted consistent with fatty infiltration. No mass lesion is seen. Portal vein is patent on color Doppler imaging with normal direction of blood flow towards the liver. Other: Imaging of the pancreas was performed given the dilatation of the common bile duct. There is prominence of  the pancreatic duct is well although the head and uncinate process for not well visualized. IMPRESSION: Gallbladder sludge and gallstones. Extrahepatic and intrahepatic biliary ductal dilatation. Given the clinical findings, CT of the abdomen and pelvis with contrast is recommended for further evaluation. Electronically Signed   By: Alcide Clever M.D.   On: 15-Aug-2020 16:44               LOS: 2 days   Larrisa Cravey  [facility]   Pager on [email or web address]. If 7PM-7AM, please contact night-coverage at [email or web address]     08/03/2020, 4:08 PM

## 2020-08-04 ENCOUNTER — Encounter: Payer: Self-pay | Admitting: Gastroenterology

## 2020-08-04 ENCOUNTER — Inpatient Hospital Stay: Payer: Medicare Other

## 2020-08-04 ENCOUNTER — Inpatient Hospital Stay: Payer: Medicare Other | Admitting: Anesthesiology

## 2020-08-04 ENCOUNTER — Encounter: Admission: EM | Disposition: E | Payer: Self-pay | Source: Home / Self Care | Attending: Internal Medicine

## 2020-08-04 DIAGNOSIS — E871 Hypo-osmolality and hyponatremia: Secondary | ICD-10-CM | POA: Diagnosis not present

## 2020-08-04 DIAGNOSIS — L8990 Pressure ulcer of unspecified site, unspecified stage: Secondary | ICD-10-CM | POA: Insufficient documentation

## 2020-08-04 DIAGNOSIS — E039 Hypothyroidism, unspecified: Secondary | ICD-10-CM | POA: Diagnosis not present

## 2020-08-04 DIAGNOSIS — K831 Obstruction of bile duct: Secondary | ICD-10-CM

## 2020-08-04 DIAGNOSIS — K8689 Other specified diseases of pancreas: Secondary | ICD-10-CM | POA: Diagnosis not present

## 2020-08-04 HISTORY — PX: ERCP: SHX5425

## 2020-08-04 LAB — BASIC METABOLIC PANEL
Anion gap: 9 (ref 5–15)
BUN: 11 mg/dL (ref 8–23)
CO2: 25 mmol/L (ref 22–32)
Calcium: 8.5 mg/dL — ABNORMAL LOW (ref 8.9–10.3)
Chloride: 99 mmol/L (ref 98–111)
Creatinine, Ser: <0.3 mg/dL — ABNORMAL LOW (ref 0.44–1.00)
Glucose, Bld: 194 mg/dL — ABNORMAL HIGH (ref 70–99)
Potassium: 3.4 mmol/L — ABNORMAL LOW (ref 3.5–5.1)
Sodium: 133 mmol/L — ABNORMAL LOW (ref 135–145)

## 2020-08-04 LAB — GLUCOSE, CAPILLARY
Glucose-Capillary: 182 mg/dL — ABNORMAL HIGH (ref 70–99)
Glucose-Capillary: 195 mg/dL — ABNORMAL HIGH (ref 70–99)
Glucose-Capillary: 211 mg/dL — ABNORMAL HIGH (ref 70–99)
Glucose-Capillary: 245 mg/dL — ABNORMAL HIGH (ref 70–99)

## 2020-08-04 LAB — CA 19-9 (SERIAL): CA 19-9: 12702 U/mL — ABNORMAL HIGH (ref 0–35)

## 2020-08-04 LAB — MAGNESIUM: Magnesium: 1.6 mg/dL — ABNORMAL LOW (ref 1.7–2.4)

## 2020-08-04 LAB — T3, FREE: T3, Free: 0.7 pg/mL — ABNORMAL LOW (ref 2.0–4.4)

## 2020-08-04 LAB — AFP TUMOR MARKER: AFP, Serum, Tumor Marker: 1.2 ng/mL (ref 0.0–8.3)

## 2020-08-04 LAB — PHOSPHORUS: Phosphorus: UNDETERMINED mg/dL (ref 2.5–4.6)

## 2020-08-04 SURGERY — ERCP, WITH INTERVENTION IF INDICATED
Anesthesia: General

## 2020-08-04 MED ORDER — PROPOFOL 10 MG/ML IV BOLUS
INTRAVENOUS | Status: DC | PRN
  Administered 2020-08-04 (×3): 20 mg via INTRAVENOUS

## 2020-08-04 MED ORDER — KETAMINE HCL 10 MG/ML IJ SOLN
INTRAMUSCULAR | Status: DC | PRN
  Administered 2020-08-04: 10 mg via INTRAVENOUS
  Administered 2020-08-04 (×2): 20 mg via INTRAVENOUS

## 2020-08-04 MED ORDER — GLUCAGON HCL RDNA (DIAGNOSTIC) 1 MG IJ SOLR
INTRAMUSCULAR | Status: DC | PRN
  Administered 2020-08-04 (×2): .5 mg via INTRAVENOUS

## 2020-08-04 MED ORDER — PHENYLEPHRINE HCL (PRESSORS) 10 MG/ML IV SOLN
INTRAVENOUS | Status: DC | PRN
  Administered 2020-08-04 (×2): 200 ug via INTRAVENOUS

## 2020-08-04 MED ORDER — GLUCAGON HCL RDNA (DIAGNOSTIC) 1 MG IJ SOLR
INTRAMUSCULAR | Status: AC
  Filled 2020-08-04: qty 1

## 2020-08-04 MED ORDER — POTASSIUM CHLORIDE IN NACL 40-0.9 MEQ/L-% IV SOLN
INTRAVENOUS | Status: DC
  Filled 2020-08-04 (×4): qty 1000

## 2020-08-04 MED ORDER — DEXMEDETOMIDINE HCL 200 MCG/2ML IV SOLN
INTRAVENOUS | Status: DC | PRN
  Administered 2020-08-04: 20 ug via INTRAVENOUS

## 2020-08-04 MED ORDER — LACTATED RINGERS IV SOLN: INTRAVENOUS | Status: DC

## 2020-08-04 MED ORDER — LIDOCAINE HCL (CARDIAC) PF 100 MG/5ML IV SOSY
PREFILLED_SYRINGE | INTRAVENOUS | Status: DC | PRN
  Administered 2020-08-04: 60 mg via INTRAVENOUS

## 2020-08-04 MED ORDER — MAGNESIUM SULFATE 2 GM/50ML IV SOLN
2.0000 g | Freq: Once | INTRAVENOUS | Status: AC
  Administered 2020-08-04: 2 g via INTRAVENOUS
  Filled 2020-08-04: qty 50

## 2020-08-04 MED ORDER — DEXMEDETOMIDINE (PRECEDEX) IN NS 20 MCG/5ML (4 MCG/ML) IV SYRINGE
PREFILLED_SYRINGE | INTRAVENOUS | Status: AC
  Filled 2020-08-04: qty 5

## 2020-08-04 MED ORDER — KETAMINE HCL 50 MG/5ML IJ SOSY
PREFILLED_SYRINGE | INTRAMUSCULAR | Status: AC
  Filled 2020-08-04: qty 5

## 2020-08-04 MED ORDER — PROPOFOL 500 MG/50ML IV EMUL
INTRAVENOUS | Status: DC | PRN
  Administered 2020-08-04: 20 ug/kg/min via INTRAVENOUS

## 2020-08-04 MED ORDER — INSULIN GLARGINE 100 UNIT/ML ~~LOC~~ SOLN
6.0000 [IU] | Freq: Every day | SUBCUTANEOUS | Status: DC
  Administered 2020-08-04: 6 [IU] via SUBCUTANEOUS
  Filled 2020-08-04 (×2): qty 0.06

## 2020-08-04 NOTE — Anesthesia Procedure Notes (Signed)
Procedure Name: General with mask airway Performed by: Fletcher-Harrison, Schneider Warchol, CRNA Pre-anesthesia Checklist: Patient identified, Emergency Drugs available, Suction available and Patient being monitored Patient Re-evaluated:Patient Re-evaluated prior to induction Oxygen Delivery Method: Simple face mask Induction Type: IV induction Placement Confirmation: positive ETCO2 and CO2 detector Dental Injury: Teeth and Oropharynx as per pre-operative assessment        

## 2020-08-04 NOTE — Progress Notes (Signed)
   Aug 12, 2020 1649  Assess: MEWS Score  Temp (!) 97.3 F (36.3 C)  BP (!) 83/57  Pulse Rate 82  Resp 16  SpO2 100 %  Assess: MEWS Score  MEWS Temp 0  MEWS Systolic 1  MEWS Pulse 0  MEWS RR 0  MEWS LOC 1  MEWS Score 2  MEWS Score Color Yellow  Assess: if the MEWS score is Yellow or Red  Were vital signs taken at a resting state? Yes  Focused Assessment Change from prior assessment (see assessment flowsheet)  Early Detection of Sepsis Score *See Row Information* Low  MEWS guidelines implemented *See Row Information* Yes  Treat  MEWS Interventions Administered scheduled meds/treatments  Pain Scale 0-10  Pain Score 0  Take Vital Signs  Increase Vital Sign Frequency  Yellow: Q 2hr X 2 then Q 4hr X 2, if remains yellow, continue Q 4hrs  Escalate  MEWS: Escalate Yellow: discuss with charge nurse/RN and consider discussing with provider and RRT  Notify: Charge Nurse/RN  Name of Charge Nurse/RN Notified Rochel Brome, RN  Date Charge Nurse/RN Notified 08-12-20  Time Charge Nurse/RN Notified 3149  Notify: Provider  Provider Name/Title Dr Mal Misty  Date Provider Notified 08/12/2020  Time Provider Notified 1650  Notification Type Page  Notification Reason Change in status  Response No new orders  Date of Provider Response 08/12/2020  Time of Provider Response 1650  Document  Patient Outcome Other (Comment)  Progress note created (see row info) Yes   Per MD continue maintenance fluids. Will continue to monitor

## 2020-08-04 NOTE — Anesthesia Preprocedure Evaluation (Signed)
Anesthesia Evaluation  Patient identified by MRN, date of birth, ID band Patient confused    Reviewed: Allergy & Precautions, NPO status , Patient's Chart, lab work & pertinent test results  History of Anesthesia Complications Negative for: history of anesthetic complications  Airway Mallampati: II       Dental   Pulmonary neg sleep apnea, neg COPD, Not current smoker,           Cardiovascular hypertension, Pt. on medications (-) Past MI and (-) CHF (-) dysrhythmias (-) Valvular Problems/Murmurs     Neuro/Psych neg Seizures Dementia    GI/Hepatic Neg liver ROS, neg GERD  ,  Endo/Other  diabetes, Type 2, Oral Hypoglycemic AgentsHypothyroidism   Renal/GU negative Renal ROS     Musculoskeletal   Abdominal   Peds  Hematology   Anesthesia Other Findings   Reproductive/Obstetrics                             Anesthesia Physical Anesthesia Plan  ASA: III and emergent  Anesthesia Plan: General   Post-op Pain Management:    Induction: Intravenous  PONV Risk Score and Plan: 3 and Propofol infusion and TIVA  Airway Management Planned: Nasal Cannula  Additional Equipment:   Intra-op Plan:   Post-operative Plan:   Informed Consent: I have reviewed the patients History and Physical, chart, labs and discussed the procedure including the risks, benefits and alternatives for the proposed anesthesia with the patient or authorized representative who has indicated his/her understanding and acceptance.       Plan Discussed with:   Anesthesia Plan Comments:         Anesthesia Quick Evaluation

## 2020-08-04 NOTE — Op Note (Signed)
[facility] Patient Name: Deborah Thornton Procedure Date: 08/06/2020 2:53 PM MRN: [id number] Account #: [id number] Date of Birth: 1946/06/08 Admit Type: Inpatient Age: 75 Room: [facility] ENDO ROOM 4 Gender: Female Note Status: Finalized Procedure:             ERCP Indications:           Tumor of the head of pancreas Providers:             Lucilla Lame MD, MD Referring MD:          No Local Md, MD (Referring MD) Medicines:             Propofol per Anesthesia Complications:         No immediate complications. Procedure:             Pre-Anesthesia Assessment:                        - Prior to the procedure, a History and Physical was                         performed, and patient medications and allergies were                         reviewed. The patient's tolerance of previous                         anesthesia was also reviewed. The risks and benefits                         of the procedure and the sedation options and risks                         were discussed with the patient. All questions were                         answered, and informed consent was obtained. Prior                         Anticoagulants: The patient has taken no previous                         anticoagulant or antiplatelet agents. ASA Grade                         Assessment: III - A patient with severe systemic                         disease. After reviewing the risks and benefits, the                         patient was deemed in satisfactory condition to                         undergo the procedure.                        After obtaining informed consent, the scope was passed  under direct vision. Throughout the procedure, the                         patient's blood pressure, pulse, and oxygen                         saturations were monitored continuously. The was                         introduced through the mouth, and used to inject                          contrast into and used to inject contrast into the                         bile duct. The ERCP was performed with moderate                         difficulty due to challenging cannulation because of                         inability to visualize the major papilla. The patient                         tolerated the procedure well. Findings:      The scout film was normal. The bile duct could not be cannulated with       the short-nosed traction sphincterotome. The bile duct was deeply       cannulated with the needle knife using a freehand technique. Contrast       was injected. I personally interpreted the bile duct images. There was       brisk flow of contrast through the ducts. Image quality was excellent.       Contrast extended to the entire biliary tree. A wire was passed into the       biliary tree. The bile duct was deeply cannulated. Contrast was       injected. The lower third of the main bile duct contained a single       segmental stenosis. The entire biliary tree was moderately dilated. The       biliary sphincterotomy was extended with a traction (standard)       sphincterotome using ERBE electrocautery. There was no       post-sphincterotomy bleeding. One 10 Fr by 7 cm plastic stent with a       single external flap and a single internal flap was placed 5 cm into the       common bile duct. Bile flowed through the stent. The stent was in good       position. 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 The stricture                         was malignant appearing.                        -  The entire biliary tree was moderately dilated.                        - A biliary sphincterotomy was performed.                        - One plastic stent was placed into the common bile                         duct. Recommendation:        - Clear liquid diet.                        -  Repeat ERCP in 3 months to exchange stent.                        - Return patient to hospital ward for ongoing care. Procedure Code(s):     --- Professional ---                        [phone], Endoscopic retrograde cholangiopancreatography                         (ERCP); with placement of endoscopic stent into                         biliary or pancreatic duct, including pre- and                         post-dilation and guide wire passage, when performed,                         including sphincterotomy, when performed, each stent                        [id number], Endoscopic catheterization of the biliary                         ductal system, radiological supervision and                         interpretation Diagnosis Code(s):     --- Professional ---                        D49.0, Neoplasm of unspecified behavior of digestive                         system                        K83.8, Other specified diseases of biliary tract                        K83.1, Obstruction of bile duct CPT copyright 2019 American Medical Association. All rights reserved. The codes documented in this report are preliminary and upon coder review may  be revised to meet current compliance requirements. Lucilla Lame MD, MD 08/08/2020 3:59:03 PM This report has been signed electronically. Number of Addenda: 0 Note Initiated On: 08/02/2020 2:53 PM Estimated Blood Loss:  Estimated blood loss: none.  [facility]

## 2020-08-04 NOTE — Transfer of Care (Signed)
Immediate Anesthesia Transfer of Care Note  Patient: Deborah Thornton  Procedure(s) Performed: ENDOSCOPIC RETROGRADE CHOLANGIOPANCREATOGRAPHY (ERCP) (N/A )  Patient Location: PACU and Endoscopy Unit  Anesthesia Type:General  Level of Consciousness: drowsy  Airway & Oxygen Therapy: Patient Spontanous Breathing  Post-op Assessment: Report given to RN and Post -op Vital signs reviewed and stable  Post vital signs: Reviewed and stable  Last Vitals:  Vitals Value Taken Time  BP 89/57 08/25/2020 1553  Temp 36.1 C 07/30/2020 1553  Pulse 87 08/08/2020 1558  Resp 10 07/30/2020 1558  SpO2 98 % 08/28/2020 1558  Vitals shown include unvalidated device data.  Last Pain:  Vitals:   08/15/2020 1553  TempSrc: Temporal  PainSc: Asleep         Complications: No complications documented.

## 2020-08-04 NOTE — Anesthesia Postprocedure Evaluation (Signed)
Anesthesia Post Note  Patient: Deborah Thornton  Procedure(s) Performed: ENDOSCOPIC RETROGRADE CHOLANGIOPANCREATOGRAPHY (ERCP) (N/A )  Patient location during evaluation: Endoscopy Anesthesia Type: General Level of consciousness: awake and alert Pain management: pain level controlled Vital Signs Assessment: post-procedure vital signs reviewed and stable Respiratory status: spontaneous breathing, nonlabored ventilation, respiratory function stable and patient connected to nasal cannula oxygen Cardiovascular status: blood pressure returned to baseline and stable Postop Assessment: no apparent nausea or vomiting Anesthetic complications: no   No complications documented.   Last Vitals:  Vitals:   08/28/2020 1646 08/01/2020 1649  BP: (!) 83/57 (!) 83/57  Pulse:  82  Resp:  16  Temp:  (!) 36.3 C  SpO2:  100%    Last Pain:  Vitals:   08/21/2020 1649  TempSrc: Oral  PainSc:                  Precious Haws Paiden Cavell

## 2020-08-04 NOTE — Progress Notes (Signed)
Inpatient Diabetes Program Recommendations  AACE/ADA: New Consensus Statement on Inpatient Glycemic Control (2015)  Target Ranges:  Prepandial:   less than 140 mg/dL      Peak postprandial:   less than 180 mg/dL (1-2 hours)      Critically ill patients:  140 - 180 mg/dL   Lab Results  Component Value Date   GLUCAP 245 (H) 08/17/2020   HGBA1C 11.8 (H) 07/07/2020    Review of Glycemic Control Results for Deborah Thornton, Deborah Thornton (MRN [id number]) as of 08/01/2020 13:36  Ref. Range 08/03/2020 12:09 08/03/2020 17:51 08/03/2020 23:59 08/10/2020 06:07 08/15/2020 12:16  Glucose-Capillary Latest Ref Range: 70 - 99 mg/dL 291 (H) 376 (H) 211 (H) 195 (H) 245 (H)   Diabetes history: DM 2 Outpatient Diabetes medications:  Novolog 3 units tidw with meals, Lantus 15 units daily, Metformin 500 mg tid Current orders for Inpatient glycemic control:  Novolog sensitive q 6 hours  Inpatient Diabetes Program Recommendations:    Please consider restarting Lantus 6 units daily.   -Consider not restarting metformin at discharge due to elevated liver enzymes/jaundice.   Thanks  Adah Perl, RN, BC-ADM Inpatient Diabetes Coordinator Pager [phone] (8a-5p)

## 2020-08-04 NOTE — Care Management Important Message (Signed)
Important Message  Patient Details  Name: Deborah Thornton MRN: [id number] Date of Birth: 03-23-1946   Medicare Important Message Given:  Yes     Dannette Barbara 08/25/2020, 1:16 PM

## 2020-08-04 NOTE — Progress Notes (Signed)
Pt stable in recovery. Procedure completed. Attempted x1 to give report to shift nurse.

## 2020-08-04 NOTE — Progress Notes (Addendum)
Progress Note    Deborah Thornton  WCH:[id number] DOB: 12-Dec-1945  DOA: 08/06/2020 PCP: Pcp, No      Brief Narrative:    Medical records reviewed and are as summarized below:  Deborah Thornton is a 75 y.o. female female with medical history significant for type 2 diabetes mellitus with most recent hemoglobin A1c 11.8% in December 2021, Alzheimer's dementia, hyperlipidemia, acquired hypothyroidism, hypertension, who was referred by her PCP to the emergency department because of jaundice and low blood pressure.      Assessment/Plan:   Principal Problem:   Obstructive jaundice Active Problems:   Essential hypertension   Pancreatic mass   Acute hyponatremia   Hypokalemia   Acquired hypothyroidism   Protein-calorie malnutrition, severe    Body mass index is 16.5 kg/m.  Underweight, severe malnutrition, encourage adequate oral intake.  Follow-up with dietitian.    Pancreatic mass with elevated liver enzymes and obstructive jaundice: Plan for ERCP today.  Follow-up with gastroenterologist.    Hypotension: BP still low.  Blood pressure was 86/53 this morning.  She is asymptomatic.  Continue IV fluids.  Cardizem and lisinopril still on hold.  Hyponatremia: Sodium level is trending up.  Etiology unclear but it may be due to dehydration.  Continue IV fluids monitor sodium level.  Hypokalemia and hypomagnesemia: Replete potassium and magnesium respectively.  Monitor levels.  Lab could not report phosphorus level because of icterus  Type II DM: Hemoglobin A1c was 11.8 in December 2021.  Start Lantus tonight after ERCP.  Use Humalog as needed for hyperglycemia.  Discontinue Metformin at discharge  Alzheimer's dementia: Continue supportive care  Hypothyroidism: Free T4 is normal but free T3 is low (0.7).  Continue Synthroid.   Diet Order            Diet NPO time specified  Diet effective now                     Consultants:  Gastroenterologist  Procedures:  None    Medications:   . [MAR Hold] donepezil  10 mg Oral Daily  . [MAR Hold] feeding supplement (NEPRO CARB STEADY)  237 mL Oral BID BM  . [MAR Hold] insulin aspart  0-9 Units Subcutaneous Q6H  . insulin glargine  6 Units Subcutaneous QHS  . [MAR Hold] levothyroxine  25 mcg Oral QAC breakfast   Continuous Infusions: . 0.9 % NaCl with KCl 40 mEq / L 75 mL/hr at 08/28/20 1057  . lactated ringers 20 mL/hr at 08/28/2020 1408     Anti-infectives (From admission, onward)   None             Family Communication/Anticipated D/C date and plan/Code Status   DVT prophylaxis: SCDs Start: 08/02/20 0035     Code Status: DNR  Family Communication: None Disposition Plan:    Status is: Inpatient  Remains inpatient appropriate because:Altered mental status and Ongoing diagnostic testing needed not appropriate for outpatient work up   Dispo: The patient is from: Home              Anticipated d/c is to: Home              Anticipated d/c date is: 3 days              Patient currently is not medically stable to d/c.           Subjective:   Interval events noted.  She is confused.  Objective:  Vitals:   08/14/2020 0533 08/27/2020 0600 08/03/2020 0811 08/06/2020 1400  BP: (!) 86/53  97/60 97/62  Pulse: 70  73 92  Resp: 18  18 20   Temp: (!) 97.4 F (36.3 C)  97.6 F (36.4 C) (!) 97.1 F (36.2 C)  TempSrc: Oral  Oral Temporal  SpO2: 90% 100% 100% 95%  Weight:      Height:       No data found.   Intake/Output Summary (Last 24 hours) at 08/09/2020 1420 Last data filed at 08/03/2020 1900 Gross per 24 hour  Intake 0 ml  Output --  Net 0 ml   Filed Weights   08/05/2020 1401 08/02/20 0500  Weight: 40.8 kg 39.6 kg    Exam:  GEN: NAD SKIN: Jaundice EYES: Icteric ENT: MMM CV: RRR PULM: CTA B ABD: soft, ND, NT, +BS CNS: AAO x 1 (person), non focal EXT: Bilateral pedal edema.  No  tenderness      Data Reviewed:   I have personally reviewed following labs and imaging studies:  Labs: Labs show the following:   Basic Metabolic Panel: Recent Labs  Lab 08/11/2020 1451 08/02/20 0730 08/03/20 0430 08/18/2020 0313  NA 129* 129* 132* 133*  K 3.4* 4.8 4.5 3.4*  CL 89* 94* 98 99  CO2 24 23 20* 25  GLUCOSE 204* 199* 173* 194*  BUN 15 13 11 11   CREATININE <0.30* RESULTS UNAVAILABLE DUE TO INTERFERING SUBSTANCE <0.30* <0.30*  CALCIUM 9.4 9.0 8.8* 8.5*  MG  --  1.8  1.9  --  1.6*  PHOS  --   --   --  UNABLE TO REPORT DUE TO ICTERUS   GFR CrCl cannot be calculated (This lab value cannot be used to calculate CrCl because it is not a number: <0.30). Liver Function Tests: Recent Labs  Lab 08/14/2020 1451 08/02/20 0730 08/03/20 0430  AST 207* 139* 113*  ALT 241* RESULTS UNAVAILABLE DUE TO INTERFERING SUBSTANCE 147*  ALKPHOS 817* 654* 537*  BILITOT 22.5* 18.4* 16.6*  PROT 7.3 5.9* 5.1*  ALBUMIN 3.3* 2.5* 2.3*   Recent Labs  Lab 08/02/2020 1451  LIPASE 29   No results for input(s): AMMONIA in the last 168 hours. Coagulation profile Recent Labs  Lab 08/02/20 0730  INR 1.1    CBC: Recent Labs  Lab 07/31/2020 1451 08/02/20 0730  WBC 7.3  7.2 6.6  NEUTROABS 6.1  --   HGB 13.7  13.6 14.1  HCT 40.1  40.1 40.6  MCV 93.5  93.7 91.9  PLT 195  187 198   Cardiac Enzymes: No results for input(s): CKTOTAL, CKMB, CKMBINDEX, TROPONINI in the last 168 hours. BNP (last 3 results) No results for input(s): PROBNP in the last 8760 hours. CBG: Recent Labs  Lab 08/03/20 1209 08/03/20 1751 08/03/20 2359 08/12/2020 0607 07/29/2020 1216  GLUCAP 291* 376* 211* 195* 245*   D-Dimer: No results for input(s): DDIMER in the last 72 hours. Hgb A1c: No results for input(s): HGBA1C in the last 72 hours. Lipid Profile: No results for input(s): CHOL, HDL, LDLCALC, TRIG, CHOLHDL, LDLDIRECT in the last 72 hours. Thyroid function studies: Recent Labs    08/02/20 0730  08/03/20 0430  TSH 12.769*  --   T3FREE  --  0.7*   Anemia work up: No results for input(s): VITAMINB12, FOLATE, FERRITIN, TIBC, IRON, RETICCTPCT in the last 72 hours. Sepsis Labs: Recent Labs  Lab 08/19/2020 1451 08/02/20 0730  WBC 7.3  7.2 6.6    Microbiology Recent Results (from  the past 240 hour(s))  Resp Panel by RT-PCR (Flu A&B, Covid) Nasopharyngeal Swab     Status: None   Collection Time: Aug 18, 2020 10:04 PM   Specimen: Nasopharyngeal Swab; Nasopharyngeal(NP) swabs in vial transport medium  Result Value Ref Range Status   SARS Coronavirus 2 by RT PCR NEGATIVE NEGATIVE Final    Comment: (NOTE) SARS-CoV-2 target nucleic acids are NOT DETECTED.  The SARS-CoV-2 RNA is generally detectable in upper respiratory specimens during the acute phase of infection. The lowest concentration of SARS-CoV-2 viral copies this assay can detect is 138 copies/mL. A negative result does not preclude SARS-Cov-2 infection and should not be used as the sole basis for treatment or other patient management decisions. A negative result may occur with  improper specimen collection/handling, submission of specimen other than nasopharyngeal swab, presence of viral mutation(s) within the areas targeted by this assay, and inadequate number of viral copies(<138 copies/mL). A negative result must be combined with clinical observations, patient history, and epidemiological information. The expected result is Negative.  Fact Sheet for Patients:  EntrepreneurPulse.com.au  Fact Sheet for Healthcare Providers:  IncredibleEmployment.be  This test is no t yet approved or cleared by the [place] FDA and  has been authorized for detection and/or diagnosis of SARS-CoV-2 by FDA under an Emergency Use Authorization (EUA). This EUA will remain  in effect (meaning this test can be used) for the duration of the COVID-19 declaration under Section 564(b)(1) of the Act,  21 U.S.C.section 360bbb-3(b)(1), unless the authorization is terminated  or revoked sooner.       Influenza A by PCR NEGATIVE NEGATIVE Final   Influenza B by PCR NEGATIVE NEGATIVE Final    Comment: (NOTE) The Xpert Xpress SARS-CoV-2/FLU/RSV plus assay is intended as an aid in the diagnosis of influenza from Nasopharyngeal swab specimens and should not be used as a sole basis for treatment. Nasal washings and aspirates are unacceptable for Xpert Xpress SARS-CoV-2/FLU/RSV testing.  Fact Sheet for Patients: EntrepreneurPulse.com.au  Fact Sheet for Healthcare Providers: IncredibleEmployment.be  This test is not yet approved or cleared by the [place] FDA and has been authorized for detection and/or diagnosis of SARS-CoV-2 by FDA under an Emergency Use Authorization (EUA). This EUA will remain in effect (meaning this test can be used) for the duration of the COVID-19 declaration under Section 564(b)(1) of the Act, 21 U.S.C. section 360bbb-3(b)(1), unless the authorization is terminated or revoked.  Performed at [facility], [address] [place], [place] [address]     Procedures and diagnostic studies:  No results found.             LOS: 3 days   Joshawa Dubin  [facility]   Pager on [email or web address]. If 7PM-7AM, please contact night-coverage at [email or web address]     08/23/2020, 2:20 PM

## 2020-08-05 DIAGNOSIS — E871 Hypo-osmolality and hyponatremia: Secondary | ICD-10-CM | POA: Diagnosis not present

## 2020-08-05 DIAGNOSIS — K8689 Other specified diseases of pancreas: Secondary | ICD-10-CM | POA: Diagnosis not present

## 2020-08-05 DIAGNOSIS — K831 Obstruction of bile duct: Secondary | ICD-10-CM | POA: Diagnosis not present

## 2020-08-05 DIAGNOSIS — E876 Hypokalemia: Secondary | ICD-10-CM | POA: Diagnosis not present

## 2020-08-05 LAB — CBC WITH DIFFERENTIAL/PLATELET
Abs Immature Granulocytes: 0.03 10*3/uL (ref 0.00–0.07)
Basophils Absolute: 0 10*3/uL (ref 0.0–0.1)
Basophils Relative: 0 %
Eosinophils Absolute: 0 10*3/uL (ref 0.0–0.5)
Eosinophils Relative: 0 %
HCT: 33.9 % — ABNORMAL LOW (ref 36.0–46.0)
Hemoglobin: 11.7 g/dL — ABNORMAL LOW (ref 12.0–15.0)
Immature Granulocytes: 1 %
Lymphocytes Relative: 12 %
Lymphs Abs: 0.6 10*3/uL — ABNORMAL LOW (ref 0.7–4.0)
MCH: 32.1 pg (ref 26.0–34.0)
MCHC: 34.5 g/dL (ref 30.0–36.0)
MCV: 93.1 fL (ref 80.0–100.0)
Monocytes Absolute: 0.3 10*3/uL (ref 0.1–1.0)
Monocytes Relative: 6 %
Neutro Abs: 3.7 10*3/uL (ref 1.7–7.7)
Neutrophils Relative %: 81 %
Platelets: 184 10*3/uL (ref 150–400)
RBC: 3.64 MIL/uL — ABNORMAL LOW (ref 3.87–5.11)
RDW: 16.5 % — ABNORMAL HIGH (ref 11.5–15.5)
WBC: 4.6 10*3/uL (ref 4.0–10.5)
nRBC: 0 % (ref 0.0–0.2)

## 2020-08-05 LAB — BASIC METABOLIC PANEL
Anion gap: 8 (ref 5–15)
BUN: 9 mg/dL (ref 8–23)
CO2: 24 mmol/L (ref 22–32)
Calcium: 8.3 mg/dL — ABNORMAL LOW (ref 8.9–10.3)
Chloride: 104 mmol/L (ref 98–111)
Creatinine, Ser: 0.5 mg/dL (ref 0.44–1.00)
GFR, Estimated: >60 mL/min (ref >60)
Glucose, Bld: 130 mg/dL — ABNORMAL HIGH (ref 70–99)
Potassium: 3.9 mmol/L (ref 3.5–5.1)
Sodium: 136 mmol/L (ref 135–145)

## 2020-08-05 LAB — GLUCOSE, CAPILLARY
Glucose-Capillary: 236 mg/dL — ABNORMAL HIGH (ref 70–99)
Glucose-Capillary: 135 mg/dL — ABNORMAL HIGH (ref 70–99)
Glucose-Capillary: 139 mg/dL — ABNORMAL HIGH (ref 70–99)

## 2020-08-05 LAB — HEPATIC FUNCTION PANEL
ALT: 126 U/L — ABNORMAL HIGH (ref 0–44)
AST: 98 U/L — ABNORMAL HIGH (ref 15–41)
Albumin: 2 g/dL — ABNORMAL LOW (ref 3.5–5.0)
Alkaline Phosphatase: 514 U/L — ABNORMAL HIGH (ref 38–126)
Bilirubin, Direct: 5.2 mg/dL — ABNORMAL HIGH (ref 0.0–0.2)
Indirect Bilirubin: 4 mg/dL — ABNORMAL HIGH (ref 0.3–0.9)
Total Bilirubin: 9.2 mg/dL — ABNORMAL HIGH (ref 0.3–1.2)
Total Protein: 4.9 g/dL — ABNORMAL LOW (ref 6.5–8.1)

## 2020-08-05 LAB — MAGNESIUM: Magnesium: 2 mg/dL (ref 1.7–2.4)

## 2020-08-05 MED ORDER — MORPHINE SULFATE (PF) 2 MG/ML IV SOLN: 2.0000 mg | INTRAVENOUS | Status: DC | PRN

## 2020-08-05 MED ORDER — LORAZEPAM 2 MG/ML IJ SOLN: 1.0000 mg | INTRAMUSCULAR | Status: DC | PRN

## 2020-08-05 NOTE — Plan of Care (Signed)
Pt remains alert, confused but calm and cooperative with flat affect. Some moaning noted when asked to turn but pt denies pain. Vital signs stable. IVF infusing per order. Blood sugar q6  checks, WNL. Safety measures in place. Will continue to monitor.  Problem: Education: Goal: Knowledge of General Education information will improve Description: Including pain rating scale, medication(s)/side effects and non-pharmacologic comfort measures Outcome: Progressing   Problem: Health Behavior/Discharge Planning: Goal: Ability to manage health-related needs will improve Outcome: Progressing   Problem: Clinical Measurements: Goal: Ability to maintain clinical measurements within normal limits will improve Outcome: Progressing Goal: Will remain free from infection Outcome: Progressing Goal: Diagnostic test results will improve Outcome: Progressing Goal: Respiratory complications will improve Outcome: Progressing Goal: Cardiovascular complication will be avoided Outcome: Progressing   Problem: Activity: Goal: Risk for activity intolerance will decrease Outcome: Progressing   Problem: Nutrition: Goal: Adequate nutrition will be maintained Outcome: Progressing   Problem: Coping: Goal: Level of anxiety will decrease Outcome: Progressing   Problem: Elimination: Goal: Will not experience complications related to bowel motility Outcome: Progressing Goal: Will not experience complications related to urinary retention Outcome: Progressing   Problem: Pain Managment: Goal: General experience of comfort will improve Outcome: Progressing   Problem: Safety: Goal: Ability to remain free from injury will improve Outcome: Progressing   Problem: Skin Integrity: Goal: Risk for impaired skin integrity will decrease Outcome: Progressing

## 2020-08-05 NOTE — Progress Notes (Signed)
GI Inpatient Follow-up Note  Subjective:  Patient seen in follow-up for painless, obstructive jaundice 2/2 pancreatic mass. She is s/p ERCP yesterday afternoon with Dr. Allen Norris which showed single segmental biliary stricture in lower third of main bile duct which was malignant appearing, dilatation of entire biliary tree, and placement of one plastic stent in CBD. No acute events overnight. Tbili improved to 9.2 (direct component 5.2) from 16.6 yesterday. Pt still very confused and demented. Pt denies any pain.   Scheduled Inpatient Medications:  . donepezil  10 mg Oral Daily  . feeding supplement (NEPRO CARB STEADY)  237 mL Oral BID BM  . insulin aspart  0-9 Units Subcutaneous Q6H  . insulin glargine  6 Units Subcutaneous QHS  . levothyroxine  25 mcg Oral QAC breakfast    Continuous Inpatient Infusions:   . 0.9 % NaCl with KCl 40 mEq / L 75 mL/hr at 08/05/20 0303    PRN Inpatient Medications:  ondansetron (ZOFRAN) IV  Review of Systems:  Unable to obtain due to patient's dementia and confusion  Physical Examination: BP 99/62 (BP Location: Left Arm)   Pulse 89   Temp 97.6 F (36.4 C) (Oral)   Resp 18   Ht 5\' 1"  (1.549 m)   Wt 39.6 kg   SpO2 100%   BMI 16.50 kg/m    Pleasantly confused, appears comfortable.  Gen: NAD, alert to self HEENT: PEERLA, EOMI, +sceral icterus Neck: supple, no JVD or thyromegaly Chest: CTA bilaterally, no wheezes, crackles, or other adventitious sounds CV: RRR, no m/g/c/r Abd: soft, NT, ND, +BS in all four quadrants; no HSM, guarding, ridigity, or rebound tenderness Ext: no edema, well perfused with 2+ pulses, Skin: no rash or lesions noted, ++jaundiced  Lymph: no LAD  Data:  Data: Lab Results  Component Value Date   WBC 4.6 08/05/2020   HGB 11.7 (L) 08/05/2020   HCT 33.9 (L) 08/05/2020   MCV 93.1 08/05/2020   PLT 184 08/05/2020   Recent Labs  Lab 08/25/2020 1451 08/02/20 0730 08/05/20 0625  HGB 13.7  13.6 14.1 11.7*   Lab  Results  Component Value Date   NA 136 08/05/2020   K 3.9 08/05/2020   CL 104 08/05/2020   CO2 24 08/05/2020   BUN 9 08/05/2020   CREATININE 0.50 08/05/2020   Lab Results  Component Value Date   ALT 126 (H) 08/05/2020   AST 98 (H) 08/05/2020   GGT 550 (H) 08/02/2020   ALKPHOS 514 (H) 08/05/2020   BILITOT 9.2 (H) 08/05/2020   Recent Labs  Lab 08/02/20 0730  INR 1.1   RUQ [place] 08/14/2020: IMPRESSION: Gallbladder sludge and gallstones.  Extrahepatic and intrahepatic biliary ductal dilatation. Given the clinical findings, CT of the abdomen and pelvis with contrast is recommended for further evaluation.  CT abd/pelvis with contrast 08/27/2020: IMPRESSION: 1. Soft tissue mass within the pancreatic head which represents a new finding when compared to the prior study and is concerning for the presence of a pancreatic neoplasm. MRI correlation is recommended. 2. Marked severity, predominant central intrahepatic biliary dilatation, as well as dilatation of the common bile duct and pancreatic duct. 3. Cholelithiasis. 4. Mild amount of abdominal and pelvic free fluid. 5. Multiple predominantly subcentimeter splenic lesions, as described above. MRI correlation is recommended. Reference: J Am Coll Radiol 2013;10:833-839. 6. Colonic diverticulosis. 7. Aortic atherosclerosis.  Aortic Atherosclerosis (ICD10-I70.0).  MRI w/wo contrast 08/02/2020: IMPRESSION: 1. 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 This is associated with obstruction of the common bile duct as well as the distal pancreatic duct. This appears to be associated with some acute pancreatitis at this time.This lesion is intimately associated with the superior mesenteric vein, splenoportal confluence and splenic vein, but no definite arterial involvement at this time. Further evaluation with endoscopic  ultrasound and consideration for biopsy is recommended. 2. No definitive metastatic disease identified in the abdomen.  ERCP 08/08/2020: Impression:             - A single segmental biliary stricture was found in the lower third of the main bile duct. The stricture was malignant appearing. - The entire biliary tree was moderately dilated. - A biliary sphincterotomy was performed. - One plastic stent was placed into the common bile duct.  Assessment/Plan:  75 y/o Caucasianfemale with a PMH of Alzheimer's dementia, hypothyroidism, T2DM, HLD, and HTN admitted for painless, obstructive jaundice found to have pancreatic mass with biliary obstruction  1. 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 ERCP performed yesterday afternoon by Dr. Allen Norris with stent placement in CBD. CA 19-9 elevated at 12,702.  2. Elevated LFTs - improving, TBili decreased to 9.2  3. Alzheimer's dementia   4. Hypotension - holding home anti-hypertensive regimen  5. Uncontrolled T2DM - Hgb A1c 11.8% last month  COVID-19 Test: NEGATIVE   Recommendations:  -Palliative care not here this weekend. Patient is appropriate for hospice given clinical picture and likely pancreatic cancer -Poor prognosis -Discussed case with Dr. Mal Misty and he will discuss with patient's ex-husband and contact hospice.     Please call with questions or concerns.   Octavia Bruckner, PA-C [facility] [phone] (Cell)

## 2020-08-05 NOTE — Progress Notes (Signed)
Progress Note    Deborah Thornton  RSW:[id number] DOB: 1946-02-25  DOA: 08/26/2020 PCP: Pcp, No      Brief Narrative:    Medical records reviewed and are as summarized below:  Deborah Thornton is a 75 y.o. female female with medical history significant for type 2 diabetes mellitus with most recent hemoglobin A1c 11.8% in December 2021, Alzheimer's dementia, hyperlipidemia, acquired hypothyroidism, hypertension, who was referred by her PCP to the emergency department because of jaundice and low blood pressure.      Assessment/Plan:   Principal Problem:   Obstructive jaundice Active Problems:   Essential hypertension   Pancreatic mass   Acute hyponatremia   Hypokalemia   Acquired hypothyroidism   Protein-calorie malnutrition, severe   Pressure injury of skin    Body mass index is 16.5 kg/m.  Underweight, severe malnutrition, encourage adequate oral intake.  Follow-up with dietitian.    Pancreatic mass with elevated liver enzymes and obstructive jaundice: s/p ERCP with biliary stent placement.  Case was discussed with GI team.  Prognosis is poor.  Hypotension: BP is a little better.  Discontinue IV fluids.  Cardizem and lisinopril still on hold.  Hyponatremia: Improved.  Hypokalemia and hypomagnesemia: Improved  Type II DM: Hemoglobin A1c was 11.8 in December 2021.  Hyperglycemia with no longer be treated per husband's request because she has been transitioned to comfort measures.  Alzheimer's dementia: Continue supportive care.  She has been transitioned to hospice.  Hypothyroidism: Free T4 is normal but free T3 is low (0.7).  Discontinue Synthroid.   Plan of care was discussed with her husband, Mr. Bob Thornton.  Diagnoses and prognosis were discussed.  He said he does not want patient to undergo any more procedures.  He wants to focus on comfort care at this point.  Hospice team has been consulted.  Comfort measures orders have been placed.  Discontinue  Aricept, Synthroid, insulin and IV fluids.   Diet Order            Diet regular Room service appropriate? Yes; Fluid consistency: Thin  Diet effective now                    Consultants:  Gastroenterologist  Procedures:  ERCP with biliary stent placement on 08/05/2020    Medications:   . feeding supplement (NEPRO CARB STEADY)  237 mL Oral BID BM   Continuous Infusions:    Anti-infectives (From admission, onward)   None             Family Communication/Anticipated D/C date and plan/Code Status   DVT prophylaxis: SCDs Start: 08/02/20 0035     Code Status: DNR  Family Communication: Discussed plan of care with her husband Disposition Plan:    Status is: Inpatient  Remains inpatient appropriate because:Altered mental status and Ongoing diagnostic testing needed not appropriate for outpatient work up   Dispo: The patient is from: Home              Anticipated d/c is to: Home              Anticipated d/c date is: 3 days              Patient currently is not medically stable to d/c.           Subjective:   Interval events noted.  She is confused and unable to provide any history.  Objective:    Vitals:   08/15/2020 1936 07/31/2020 2343  08/05/20 0508 08/05/20 1220  BP: (!) 88/61 96/60 99/62  105/61  Pulse: 83 74 89 81  Resp: 18  18 16   Temp: (!) 97.5 F (36.4 C) 98.2 F (36.8 C) 97.6 F (36.4 C) 98.9 F (37.2 C)  TempSrc: Oral Oral Oral   SpO2: 93% 97% 100% 99%  Weight:      Height:       No data found.   Intake/Output Summary (Last 24 hours) at 08/05/2020 1329 Last data filed at 08/05/2020 0525 Gross per 24 hour  Intake 600 ml  Output 2 ml  Net 598 ml   Filed Weights   08/20/2020 1401 08/02/20 0500  Weight: 40.8 kg 39.6 kg    Exam:  GEN: NAD, cachectic SKIN: Jaundice.  Poor skin turgor EYES: Icteric.  No pallor ENT: MMM CV: RRR PULM: CTA B ABD: soft, ND, NT, +BS CNS: AAO x 1 (person), non focal EXT: Bilateral pedal  edema.  No tenderness        Data Reviewed:   I have personally reviewed following labs and imaging studies:  Labs: Labs show the following:   Basic Metabolic Panel: Recent Labs  Lab 08/22/2020 1451 08/02/20 0730 08/03/20 0430 08/26/2020 0313 08/05/20 0625  NA 129* 129* 132* 133* 136  K 3.4* 4.8 4.5 3.4* 3.9  CL 89* 94* 98 99 104  CO2 24 23 20* 25 24  GLUCOSE 204* 199* 173* 194* 130*  BUN 15 13 11 11 9   CREATININE <0.30* RESULTS UNAVAILABLE DUE TO INTERFERING SUBSTANCE <0.30* <0.30* 0.50  CALCIUM 9.4 9.0 8.8* 8.5* 8.3*  MG  --  1.8  1.9  --  1.6* 2.0  PHOS  --   --   --  UNABLE TO REPORT DUE TO ICTERUS  --    GFR Estimated Creatinine Clearance: 38 mL/min (by C-G formula based on SCr of 0.5 mg/dL). Liver Function Tests: Recent Labs  Lab 08/09/2020 1451 08/02/20 0730 08/03/20 0430 08/05/20 0746  AST 207* 139* 113* 98*  ALT 241* RESULTS UNAVAILABLE DUE TO INTERFERING SUBSTANCE 147* 126*  ALKPHOS 817* 654* 537* 514*  BILITOT 22.5* 18.4* 16.6* 9.2*  PROT 7.3 5.9* 5.1* 4.9*  ALBUMIN 3.3* 2.5* 2.3* 2.0*   Recent Labs  Lab 08/27/2020 1451  LIPASE 29   No results for input(s): AMMONIA in the last 168 hours. Coagulation profile Recent Labs  Lab 08/02/20 0730  INR 1.1    CBC: Recent Labs  Lab 08/21/2020 1451 08/02/20 0730 08/05/20 0625  WBC 7.3  7.2 6.6 4.6  NEUTROABS 6.1  --  3.7  HGB 13.7  13.6 14.1 11.7*  HCT 40.1  40.1 40.6 33.9*  MCV 93.5  93.7 91.9 93.1  PLT 195  187 198 184   Cardiac Enzymes: No results for input(s): CKTOTAL, CKMB, CKMBINDEX, TROPONINI in the last 168 hours. BNP (last 3 results) No results for input(s): PROBNP in the last 8760 hours. CBG: Recent Labs  Lab 08/23/2020 1216 08/18/2020 1836 08/22/2020 2350 08/05/20 0505 08/05/20 1209  GLUCAP 245* 236* 182* 135* 139*   D-Dimer: No results for input(s): DDIMER in the last 72 hours. Hgb A1c: No results for input(s): HGBA1C in the last 72 hours. Lipid Profile: No results for  input(s): CHOL, HDL, LDLCALC, TRIG, CHOLHDL, LDLDIRECT in the last 72 hours. Thyroid function studies: Recent Labs    08/03/20 0430  T3FREE 0.7*   Anemia work up: No results for input(s): VITAMINB12, FOLATE, FERRITIN, TIBC, IRON, RETICCTPCT in the last 72 hours. Sepsis Labs: Recent Labs  Lab 08/15/2020 1451 08/02/20 0730 08/05/20 0625  WBC 7.3  7.2 6.6 4.6    Microbiology Recent Results (from the past 240 hour(s))  Resp Panel by RT-PCR (Flu A&B, Covid) Nasopharyngeal Swab     Status: None   Collection Time: 08/25/2020 10:04 PM   Specimen: Nasopharyngeal Swab; Nasopharyngeal(NP) swabs in vial transport medium  Result Value Ref Range Status   SARS Coronavirus 2 by RT PCR NEGATIVE NEGATIVE Final    Comment: (NOTE) SARS-CoV-2 target nucleic acids are NOT DETECTED.  The SARS-CoV-2 RNA is generally detectable in upper respiratory specimens during the acute phase of infection. The lowest concentration of SARS-CoV-2 viral copies this assay can detect is 138 copies/mL. A negative result does not preclude SARS-Cov-2 infection and should not be used as the sole basis for treatment or other patient management decisions. A negative result may occur with  improper specimen collection/handling, submission of specimen other than nasopharyngeal swab, presence of viral mutation(s) within the areas targeted by this assay, and inadequate number of viral copies(<138 copies/mL). A negative result must be combined with clinical observations, patient history, and epidemiological information. The expected result is Negative.  Fact Sheet for Patients:  EntrepreneurPulse.com.au  Fact Sheet for Healthcare Providers:  IncredibleEmployment.be  This test is no t yet approved or cleared by the [place] FDA and  has been authorized for detection and/or diagnosis of SARS-CoV-2 by FDA under an Emergency Use Authorization (EUA). This EUA will remain  in effect  (meaning this test can be used) for the duration of the COVID-19 declaration under Section 564(b)(1) of the Act, 21 U.S.C.section 360bbb-3(b)(1), unless the authorization is terminated  or revoked sooner.       Influenza A by PCR NEGATIVE NEGATIVE Final   Influenza B by PCR NEGATIVE NEGATIVE Final    Comment: (NOTE) The Xpert Xpress SARS-CoV-2/FLU/RSV plus assay is intended as an aid in the diagnosis of influenza from Nasopharyngeal swab specimens and should not be used as a sole basis for treatment. Nasal washings and aspirates are unacceptable for Xpert Xpress SARS-CoV-2/FLU/RSV testing.  Fact Sheet for Patients: EntrepreneurPulse.com.au  Fact Sheet for Healthcare Providers: IncredibleEmployment.be  This test is not yet approved or cleared by the [place] FDA and has been authorized for detection and/or diagnosis of SARS-CoV-2 by FDA under an Emergency Use Authorization (EUA). This EUA will remain in effect (meaning this test can be used) for the duration of the COVID-19 declaration under Section 564(b)(1) of the Act, 21 U.S.C. section 360bbb-3(b)(1), unless the authorization is terminated or revoked.  Performed at [facility], [address], [place], [place] [address]     Procedures and diagnostic studies:  DG C-Arm 1-60 Min-No Report  Result Date: 08/24/2020 Fluoroscopy was utilized by the requesting physician.  No radiographic interpretation.               LOS: 4 days   Jeanell Mangan  [facility]   Pager on [email or web address]. If 7PM-7AM, please contact night-coverage at [email or web address]     08/05/2020, 1:29 PM

## 2020-08-06 DIAGNOSIS — K831 Obstruction of bile duct: Secondary | ICD-10-CM | POA: Diagnosis not present

## 2020-08-06 DIAGNOSIS — E871 Hypo-osmolality and hyponatremia: Secondary | ICD-10-CM | POA: Diagnosis not present

## 2020-08-06 DIAGNOSIS — E039 Hypothyroidism, unspecified: Secondary | ICD-10-CM | POA: Diagnosis not present

## 2020-08-06 DIAGNOSIS — K8689 Other specified diseases of pancreas: Secondary | ICD-10-CM | POA: Diagnosis not present

## 2020-08-06 NOTE — Progress Notes (Addendum)
Progress Note    Deborah Thornton  SVX:[id number] DOB: [birth year]  DOA: 08/21/2020 PCP: Pcp, No      Brief Narrative:    Medical records reviewed and are as summarized below:  Deborah Thornton is a 75 y.o. female female with medical history significant for type 2 diabetes mellitus with most recent hemoglobin A1c 11.8% in December 2021, Alzheimer's dementia, hyperlipidemia, acquired hypothyroidism, hypertension, who was referred by her PCP to the emergency department because of jaundice and low blood pressure.      Assessment/Plan:   Principal Problem:   Obstructive jaundice Active Problems:   Essential hypertension   Pancreatic mass   Acute hyponatremia   Hypokalemia   Acquired hypothyroidism   Protein-calorie malnutrition, severe   Pressure injury of skin    Body mass index is 16.5 kg/m.  Underweight, severe malnutrition, encourage adequate oral intake.  Follow-up with dietitian.   Pancreatic mass with elevated liver enzymes and obstructive jaundice Hypotension Hyponatremia Hypokalemia Hypomagnesemia Type 2 diabetes mellitus Alzheimer's dementia Hypothyroidism Underweight, severe protein calorie malnutrition   PLAN   S/p ERCP with biliary stent placement on 2020-08-08 Continue comfort measures. Hospice team has been consulted to assist with disposition.    Diet Order            Diet regular Room service appropriate? Yes; Fluid consistency: Thin  Diet effective now                    Consultants:  Gastroenterologist  Procedures:  ERCP with biliary stent placement on 2020-08-08    Medications:   . feeding supplement (NEPRO CARB STEADY)  237 mL Oral BID BM   Continuous Infusions:    Anti-infectives (From admission, onward)   None             Family Communication/Anticipated D/C date and plan/Code Status   DVT prophylaxis: SCDs Start: 08/02/20 0035     Code Status: DNR  Family Communication: Discussed plan of care  with her husband Disposition Plan:    Status is: Inpatient  Remains inpatient appropriate because:Altered mental status and Ongoing diagnostic testing needed not appropriate for outpatient work up   Dispo: The patient is from: Home              Anticipated d/c is to: Home              Anticipated d/c date is: 3 days              Patient currently is not medically stable to d/c.           Subjective:   Interval events noted.  She has no complaints.  She is confused unable to provide adequate history.  Objective:    Vitals:   08/05/20 0508 08/05/20 1220 08/06/20 0352 08/06/20 0803  BP: 99/62 105/61 111/65 102/62  Pulse: 89 81 84 85  Resp: 18 16 16 16   Temp: 97.6 F (36.4 C) 98.9 F (37.2 C) 98 F (36.7 C) 98.6 F (37 C)  TempSrc: Oral  Oral Oral  SpO2: 100% 99% 100% 100%  Weight:      Height:       No data found.   Intake/Output Summary (Last 24 hours) at 08/06/2020 1320 Last data filed at 08/05/2020 1504 Gross per 24 hour  Intake 792.5 ml  Output --  Net 792.5 ml   Filed Weights   08/18/2020 1401 08/02/20 0500  Weight: 40.8 kg 39.6 kg  Exam:   GEN: NAD SKIN: Jaundice.  Poor skin turgor. EYES: Icteric ENT: MMM CV: RRR PULM: CTA B ABD: soft, ND, NT, +BS CNS: AAO x 3, non focal EXT: Bilateral pedal edema.  No tenderness         Data Reviewed:   I have personally reviewed following labs and imaging studies:  Labs: Labs show the following:   Basic Metabolic Panel: Recent Labs  Lab 08-20-20 1451 08/02/20 0730 08/03/20 0430 08/15/2020 0313 08/05/20 0625  NA 129* 129* 132* 133* 136  K 3.4* 4.8 4.5 3.4* 3.9  CL 89* 94* 98 99 104  CO2 24 23 20* 25 24  GLUCOSE 204* 199* 173* 194* 130*  BUN 15 13 11 11 9   CREATININE <0.30* RESULTS UNAVAILABLE DUE TO INTERFERING SUBSTANCE <0.30* <0.30* 0.50  CALCIUM 9.4 9.0 8.8* 8.5* 8.3*  MG  --  1.8  1.9  --  1.6* 2.0  PHOS  --   --   --  UNABLE TO REPORT DUE TO ICTERUS  --    GFR Estimated  Creatinine Clearance: 38 mL/min (by C-G formula based on SCr of 0.5 mg/dL). Liver Function Tests: Recent Labs  Lab 2020/08/20 1451 08/02/20 0730 08/03/20 0430 08/05/20 0746  AST 207* 139* 113* 98*  ALT 241* RESULTS UNAVAILABLE DUE TO INTERFERING SUBSTANCE 147* 126*  ALKPHOS 817* 654* 537* 514*  BILITOT 22.5* 18.4* 16.6* 9.2*  PROT 7.3 5.9* 5.1* 4.9*  ALBUMIN 3.3* 2.5* 2.3* 2.0*   Recent Labs  Lab 20-Aug-2020 1451  LIPASE 29   No results for input(s): AMMONIA in the last 168 hours. Coagulation profile Recent Labs  Lab 08/02/20 0730  INR 1.1    CBC: Recent Labs  Lab 20-Aug-2020 1451 08/02/20 0730 08/05/20 0625  WBC 7.3  7.2 6.6 4.6  NEUTROABS 6.1  --  3.7  HGB 13.7  13.6 14.1 11.7*  HCT 40.1  40.1 40.6 33.9*  MCV 93.5  93.7 91.9 93.1  PLT 195  187 198 184   Cardiac Enzymes: No results for input(s): CKTOTAL, CKMB, CKMBINDEX, TROPONINI in the last 168 hours. BNP (last 3 results) No results for input(s): PROBNP in the last 8760 hours. CBG: Recent Labs  Lab 08/13/2020 1216 08/09/2020 1836 08/24/2020 2350 08/05/20 0505 08/05/20 1209  GLUCAP 245* 236* 182* 135* 139*   D-Dimer: No results for input(s): DDIMER in the last 72 hours. Hgb A1c: No results for input(s): HGBA1C in the last 72 hours. Lipid Profile: No results for input(s): CHOL, HDL, LDLCALC, TRIG, CHOLHDL, LDLDIRECT in the last 72 hours. Thyroid function studies: No results for input(s): TSH, T4TOTAL, T3FREE, THYROIDAB in the last 72 hours.  Invalid input(s): FREET3 Anemia work up: No results for input(s): VITAMINB12, FOLATE, FERRITIN, TIBC, IRON, RETICCTPCT in the last 72 hours. Sepsis Labs: Recent Labs  Lab 2020/08/20 1451 08/02/20 0730 08/05/20 0625  WBC 7.3  7.2 6.6 4.6    Microbiology Recent Results (from the past 240 hour(s))  Resp Panel by RT-PCR (Flu A&B, Covid) Nasopharyngeal Swab     Status: None   Collection Time: 08/20/20 10:04 PM   Specimen: Nasopharyngeal Swab; Nasopharyngeal(NP)  swabs in vial transport medium  Result Value Ref Range Status   SARS Coronavirus 2 by RT PCR NEGATIVE NEGATIVE Final    Comment: (NOTE) SARS-CoV-2 target nucleic acids are NOT DETECTED.  The SARS-CoV-2 RNA is generally detectable in upper respiratory specimens during the acute phase of infection. The lowest concentration of SARS-CoV-2 viral copies this assay can detect is 138 copies/mL.  A negative result does not preclude SARS-Cov-2 infection and should not be used as the sole basis for treatment or other patient management decisions. A negative result may occur with  improper specimen collection/handling, submission of specimen other than nasopharyngeal swab, presence of viral mutation(s) within the areas targeted by this assay, and inadequate number of viral copies(<138 copies/mL). A negative result must be combined with clinical observations, patient history, and epidemiological information. The expected result is Negative.  Fact Sheet for Patients:  EntrepreneurPulse.com.au  Fact Sheet for Healthcare Providers:  IncredibleEmployment.be  This test is no t yet approved or cleared by the [place] FDA and  has been authorized for detection and/or diagnosis of SARS-CoV-2 by FDA under an Emergency Use Authorization (EUA). This EUA will remain  in effect (meaning this test can be used) for the duration of the COVID-19 declaration under Section 564(b)(1) of the Act, 21 U.S.C.section 360bbb-3(b)(1), unless the authorization is terminated  or revoked sooner.       Influenza A by PCR NEGATIVE NEGATIVE Final   Influenza B by PCR NEGATIVE NEGATIVE Final    Comment: (NOTE) The Xpert Xpress SARS-CoV-2/FLU/RSV plus assay is intended as an aid in the diagnosis of influenza from Nasopharyngeal swab specimens and should not be used as a sole basis for treatment. Nasal washings and aspirates are unacceptable for Xpert Xpress  SARS-CoV-2/FLU/RSV testing.  Fact Sheet for Patients: EntrepreneurPulse.com.au  Fact Sheet for Healthcare Providers: IncredibleEmployment.be  This test is not yet approved or cleared by the [place] FDA and has been authorized for detection and/or diagnosis of SARS-CoV-2 by FDA under an Emergency Use Authorization (EUA). This EUA will remain in effect (meaning this test can be used) for the duration of the COVID-19 declaration under Section 564(b)(1) of the Act, 21 U.S.C. section 360bbb-3(b)(1), unless the authorization is terminated or revoked.  Performed at [facility], [address] [place], [place] [address]     Procedures and diagnostic studies:  DG C-Arm 1-60 Min-No Report  Result Date: 08/19/2020 Fluoroscopy was utilized by the requesting physician.  No radiographic interpretation.               LOS: 5 days   Chao Blazejewski  [facility]   Pager on [email or web address]. If 7PM-7AM, please contact night-coverage at [email or web address]     08/06/2020, 1:20 PM

## 2020-08-06 NOTE — TOC Progression Note (Addendum)
Transition of Care [facility]) - Progression Note    Patient Details  Name: Gissel A Martinique MRN: [id number] Date of Birth: 1945/09/15  Transition of Care [facility]) CM/SW Contact  Izola Price, RN Phone Number: 08/06/2020, 9:10 AM  Clinical Narrative:    08/06/20 Hospice referral placed via central contact. Will expect return call soon. All patient information needed relayed to [facility] at [phone]. Simmie Davies RN CM  08/06/20 1000 am. Mariann Laster from [facility] returned call. They are not able to set up [place] over the weekend. Notified provider and Unit RN to clarify is [facility] or inpatient hospice or maintaining comfort care at [facility] till Monday. Simmie Davies RN CM  08/06/20 1311 Continuing Hospice referral.  Some discussion between [place] and inpatient hospice.   CM called Husband and he expressed wishes for inpatient vs [facility] hospice, as he states he has Covid and is unable to care for her even with HH. according to husband, patient has not eaten in a month PTA, weights 80 lbs, and is not taking anything by mouth.   Comfort Care measures in place.   I have spoken to [place] at [facility] and she has referred me to Inpatient admissions RN. Waiting on call back.   Messaged provider with spouses input and request to proceed with inpatient hospice assessment, and go from there if [facility] is more appropriate over inpatient and can assist in assessng provide appropriate level of care for this patient.   [place] with Debbie from hospice inpatient. There are no inpatient beds at this time, but she requested a referral packet and will pass on to [facility] on Monday for an evaluation. Spouse having Covid may be a barrier for [place] in addition to patient's condition.   Packet faxed.   Simmie Davies RN CM       Expected Discharge Plan and Services                                                 Social Determinants of Health (SDOH) Interventions    Readmission Risk Interventions No  flowsheet data found.

## 2020-08-07 DIAGNOSIS — K831 Obstruction of bile duct: Secondary | ICD-10-CM | POA: Diagnosis not present

## 2020-08-07 DIAGNOSIS — K8689 Other specified diseases of pancreas: Secondary | ICD-10-CM | POA: Diagnosis not present

## 2020-08-07 DIAGNOSIS — E43 Unspecified severe protein-calorie malnutrition: Secondary | ICD-10-CM | POA: Diagnosis not present

## 2020-08-07 DIAGNOSIS — U071 COVID-19: Secondary | ICD-10-CM | POA: Clinically undetermined

## 2020-08-07 LAB — RESP PANEL BY RT-PCR (FLU A&B, COVID) ARPGX2
Influenza A by PCR: NEGATIVE
Influenza B by PCR: NEGATIVE
SARS Coronavirus 2 by RT PCR: POSITIVE — AB

## 2020-08-07 NOTE — Progress Notes (Addendum)
Progress Note    Deborah Thornton  PYK:[id number] DOB: 1946/01/08  DOA: 07/30/2020 PCP: Pcp, No      Brief Narrative:    Medical records reviewed and are as summarized below:   Ms. Deborah Thornton a 75 y.o.femalefemalewith medical history significant fortype 2 diabetes mellitus with most recent hemoglobin A1c 11.8% in December 2021, Alzheimer's dementia, hyperlipidemia, acquired hypothyroidism, hypertension,who was referred by her PCP to the emergency department because of jaundice and low blood pressure.  Her husband reported that patient had been eating poorly and had loss significant amount of weight.  She was hypotensive in the hospital and was treated with IV fluids.  She was also found to have pancreatic mass, concerning for pancreatic cancer, with elevated liver enzymes and obstructive jaundice.  CA 19-9 was elevated at 12,702.  She had electrolyte abnormalities including hyponatremia, hypomagnesemia and hypokalemia that were treated.  She was seen in consultation by the gastroenterologist.  She underwent ERCP with biliary stent placement on Aug 24, 2020.  She tested positive for COVID-19 infection.  Given that overall clinical picture connotes a poor prognosis, her husband opted for comfort measures with hospice.  She was transitioned to comfort care.  She was evaluated by the hospice team.  Per hospice team, patient is a candidate for comfort measures at the hospice home.     Assessment/Plan:   Principal Problem:   Pancreatic mass Active Problems:   Essential hypertension   Obstructive jaundice   Acute hyponatremia   Hypokalemia   Acquired hypothyroidism   Protein-calorie malnutrition, severe   Pressure injury of skin, stage II coccygeal decubitus ulcer   COVID-19 virus infection    Body mass index is 16.5 kg/m.  Underweight, severe malnutrition, encourage adequate oral intake.  Follow-up with dietitian.   Pancreatic mass with elevated liver enzymes and  obstructive jaundice Hypotension Hyponatremia Hypokalemia Hypomagnesemia Type 2 diabetes mellitus Alzheimer's dementia Hypothyroidism Underweight, severe protein calorie malnutrition COVID-19 infection  PLAN   S/p ERCP with biliary stent placement on August 24, 2020 She tested positive for Covid today.  Of note, initial Covid test done on 08/10/2020 was negative.  Her husband also has COVID-19 infection.  Unfortunately, she will not be able to go to the hospice home today as previously planned.  Her husband said he is unable to care for her at home.  Follow-up with social worker to assist with disposition. Continue comfort measures. Plan of care was discussed with her husband.    Diet Order            Diet general           Diet regular Room service appropriate? Yes; Fluid consistency: Thin  Diet effective now                    Consultants:  Gastroenterologist  Procedures:  ERCP with biliary stent placement on 2020-08-24    Medications:   . feeding supplement (NEPRO CARB STEADY)  237 mL Oral BID BM   Continuous Infusions:    Anti-infectives (From admission, onward)   None             Family Communication/Anticipated D/C date and plan/Code Status   DVT prophylaxis: SCDs Start: 08/02/20 0035     Code Status: DNR  Family Communication: Discussed plan of care with her husband Disposition Plan:    Status is: Inpatient  Remains inpatient appropriate because:Unsafe d/c plan   Dispo: The patient is from: Home  Anticipated d/c is to: Home              Anticipated d/c date is: 3 days              Patient currently is medically stable to d/c.           Subjective:   Interval events noted.  She has no complaints.  She still confused.  Objective:    Vitals:   08/06/20 0352 08/06/20 0803 08/06/20 1941 08/07/20 0731  BP: 111/65 102/62 98/69 104/67  Pulse: 84 85 78 75  Resp: 16 16 20    Temp: 98 F (36.7 C) 98.6 F (37 C)  97.9 F (36.6 C) 98.6 F (37 C)  TempSrc: Oral Oral Oral Oral  SpO2: 100% 100% 100% 99%  Weight:      Height:       No data found.   Intake/Output Summary (Last 24 hours) at 08/07/2020 1509 Last data filed at 08/07/2020 1300 Gross per 24 hour  Intake 240 ml  Output --  Net 240 ml   Filed Weights   08/25/2020 1401 08/02/20 0500  Weight: 40.8 kg 39.6 kg    Exam:   GEN: NAD SKIN: Jaundice.  Poor skin turgor.  Stage II right coccygeal decubitus ulcer EYES: Icteric ENT: MMM CV: RRR PULM: CTA B ABD: soft, ND, NT, +BS CNS: AAO x 3, non focal EXT: Bilateral pedal edema    Pressure Injury 08/01/2020 Coccyx Right Stage 2 -  Partial thickness loss of dermis presenting as a shallow open injury with a red, pink wound bed without slough. pale pink measres .5cm x.5cm (Active)  08/13/2020 1510  Location: Coccyx  Location Orientation: Right  Staging: Stage 2 -  Partial thickness loss of dermis presenting as a shallow open injury with a red, pink wound bed without slough.  Wound Description (Comments): pale pink measres .5cm x.5cm  Present on Admission:             Data Reviewed:   I have personally reviewed following labs and imaging studies:  Labs: Labs show the following:   Basic Metabolic Panel: Recent Labs  Lab 08/07/2020 1451 08/02/20 0730 08/03/20 0430 08/09/2020 0313 08/05/20 0625  NA 129* 129* 132* 133* 136  K 3.4* 4.8 4.5 3.4* 3.9  CL 89* 94* 98 99 104  CO2 24 23 20* 25 24  GLUCOSE 204* 199* 173* 194* 130*  BUN 15 13 11 11 9   CREATININE <0.30* RESULTS UNAVAILABLE DUE TO INTERFERING SUBSTANCE <0.30* <0.30* 0.50  CALCIUM 9.4 9.0 8.8* 8.5* 8.3*  MG  --  1.8  1.9  --  1.6* 2.0  PHOS  --   --   --  UNABLE TO REPORT DUE TO ICTERUS  --    GFR Estimated Creatinine Clearance: 38 mL/min (by C-G formula based on SCr of 0.5 mg/dL). Liver Function Tests: Recent Labs  Lab 08/05/2020 1451 08/02/20 0730 08/03/20 0430 08/05/20 0746  AST 207* 139* 113* 98*  ALT  241* RESULTS UNAVAILABLE DUE TO INTERFERING SUBSTANCE 147* 126*  ALKPHOS 817* 654* 537* 514*  BILITOT 22.5* 18.4* 16.6* 9.2*  PROT 7.3 5.9* 5.1* 4.9*  ALBUMIN 3.3* 2.5* 2.3* 2.0*   Recent Labs  Lab 08/24/2020 1451  LIPASE 29   No results for input(s): AMMONIA in the last 168 hours. Coagulation profile Recent Labs  Lab 08/02/20 0730  INR 1.1    CBC: Recent Labs  Lab 07/31/2020 1451 08/02/20 0730 08/05/20 0625  WBC 7.3  7.2 6.6  4.6  NEUTROABS 6.1  --  3.7  HGB 13.7  13.6 14.1 11.7*  HCT 40.1  40.1 40.6 33.9*  MCV 93.5  93.7 91.9 93.1  PLT 195  187 198 184   Cardiac Enzymes: No results for input(s): CKTOTAL, CKMB, CKMBINDEX, TROPONINI in the last 168 hours. BNP (last 3 results) No results for input(s): PROBNP in the last 8760 hours. CBG: Recent Labs  Lab 08/11/2020 1216 08/11/2020 1836 08/28/2020 2350 08/05/20 0505 08/05/20 1209  GLUCAP 245* 236* 182* 135* 139*   D-Dimer: No results for input(s): DDIMER in the last 72 hours. Hgb A1c: No results for input(s): HGBA1C in the last 72 hours. Lipid Profile: No results for input(s): CHOL, HDL, LDLCALC, TRIG, CHOLHDL, LDLDIRECT in the last 72 hours. Thyroid function studies: No results for input(s): TSH, T4TOTAL, T3FREE, THYROIDAB in the last 72 hours.  Invalid input(s): FREET3 Anemia work up: No results for input(s): VITAMINB12, FOLATE, FERRITIN, TIBC, IRON, RETICCTPCT in the last 72 hours. Sepsis Labs: Recent Labs  Lab 08/09/2020 1451 08/02/20 0730 08/05/20 0625  WBC 7.3  7.2 6.6 4.6    Microbiology Recent Results (from the past 240 hour(s))  Resp Panel by RT-PCR (Flu A&B, Covid) Nasopharyngeal Swab     Status: None   Collection Time: 08/06/2020 10:04 PM   Specimen: Nasopharyngeal Swab; Nasopharyngeal(NP) swabs in vial transport medium  Result Value Ref Range Status   SARS Coronavirus 2 by RT PCR NEGATIVE NEGATIVE Final    Comment: (NOTE) SARS-CoV-2 target nucleic acids are NOT DETECTED.  The SARS-CoV-2 RNA  is generally detectable in upper respiratory specimens during the acute phase of infection. The lowest concentration of SARS-CoV-2 viral copies this assay can detect is 138 copies/mL. A negative result does not preclude SARS-Cov-2 infection and should not be used as the sole basis for treatment or other patient management decisions. A negative result may occur with  improper specimen collection/handling, submission of specimen other than nasopharyngeal swab, presence of viral mutation(s) within the areas targeted by this assay, and inadequate number of viral copies(<138 copies/mL). A negative result must be combined with clinical observations, patient history, and epidemiological information. The expected result is Negative.  Fact Sheet for Patients:  EntrepreneurPulse.com.au  Fact Sheet for Healthcare Providers:  IncredibleEmployment.be  This test is no t yet approved or cleared by the [place] FDA and  has been authorized for detection and/or diagnosis of SARS-CoV-2 by FDA under an Emergency Use Authorization (EUA). This EUA will remain  in effect (meaning this test can be used) for the duration of the COVID-19 declaration under Section 564(b)(1) of the Act, 21 U.S.C.section 360bbb-3(b)(1), unless the authorization is terminated  or revoked sooner.       Influenza A by PCR NEGATIVE NEGATIVE Final   Influenza B by PCR NEGATIVE NEGATIVE Final    Comment: (NOTE) The Xpert Xpress SARS-CoV-2/FLU/RSV plus assay is intended as an aid in the diagnosis of influenza from Nasopharyngeal swab specimens and should not be used as a sole basis for treatment. Nasal washings and aspirates are unacceptable for Xpert Xpress SARS-CoV-2/FLU/RSV testing.  Fact Sheet for Patients: EntrepreneurPulse.com.au  Fact Sheet for Healthcare Providers: IncredibleEmployment.be  This test is not yet approved or cleared by the  [place] FDA and has been authorized for detection and/or diagnosis of SARS-CoV-2 by FDA under an Emergency Use Authorization (EUA). This EUA will remain in effect (meaning this test can be used) for the duration of the COVID-19 declaration under Section 564(b)(1) of the Act, 21  U.S.C. section 360bbb-3(b)(1), unless the authorization is terminated or revoked.  Performed at [facility], [address] [place], [place] [address]   Resp Panel by RT-PCR (Flu A&B, Covid) Nasopharyngeal Swab     Status: Abnormal   Collection Time: 08/07/20  1:33 PM   Specimen: Nasopharyngeal Swab; Nasopharyngeal(NP) swabs in vial transport medium  Result Value Ref Range Status   SARS Coronavirus 2 by RT PCR POSITIVE (A) NEGATIVE Final    Comment: RESULT CALLED TO, READ BACK BY AND VERIFIED WITH: SHEREA COVINGTON ADAMS 08/07/20 AT 1437 BY ACR (NOTE) SARS-CoV-2 target nucleic acids are DETECTED.  The SARS-CoV-2 RNA is generally detectable in upper respiratory specimens during the acute phase of infection. Positive results are indicative of the presence of the identified virus, but do not rule out bacterial infection or co-infection with other pathogens not detected by the test. Clinical correlation with patient history and other diagnostic information is necessary to determine patient infection status. The expected result is Negative.  Fact Sheet for Patients: EntrepreneurPulse.com.au  Fact Sheet for Healthcare Providers: IncredibleEmployment.be  This test is not yet approved or cleared by the [place] FDA and  has been authorized for detection and/or diagnosis of SARS-CoV-2 by FDA under an Emergency Use Authorization (EUA).  This EUA will remain in effect (meaning this  test can be used) for the duration of  the COVID-19 declaration under Section 564(b)(1) of the Act, 21 U.S.C. section 360bbb-3(b)(1), unless the authorization is terminated or  revoked sooner.     Influenza A by PCR NEGATIVE NEGATIVE Final   Influenza B by PCR NEGATIVE NEGATIVE Final    Comment: (NOTE) The Xpert Xpress SARS-CoV-2/FLU/RSV plus assay is intended as an aid in the diagnosis of influenza from Nasopharyngeal swab specimens and should not be used as a sole basis for treatment. Nasal washings and aspirates are unacceptable for Xpert Xpress SARS-CoV-2/FLU/RSV testing.  Fact Sheet for Patients: EntrepreneurPulse.com.au  Fact Sheet for Healthcare Providers: IncredibleEmployment.be  This test is not yet approved or cleared by the [place] FDA and has been authorized for detection and/or diagnosis of SARS-CoV-2 by FDA under an Emergency Use Authorization (EUA). This EUA will remain in effect (meaning this test can be used) for the duration of the COVID-19 declaration under Section 564(b)(1) of the Act, 21 U.S.C. section 360bbb-3(b)(1), unless the authorization is terminated or revoked.  Performed at [facility], [address] [place], [place] [address]     Procedures and diagnostic studies:  No results found.             LOS: 6 days   Teila Skalsky  [facility]   Pager on [email or web address]. If 7PM-7AM, please contact night-coverage at [email or web address]     08/07/2020, 3:09 PM

## 2020-08-07 NOTE — Progress Notes (Signed)
Per Juliann Pulse with [facility] patient may not qualify for inpatient hospice. Juliann Pulse will call tomorrow to reevaluate.   Fuller Mandril, RN

## 2020-08-07 NOTE — Care Management Important Message (Signed)
Important Message  Patient Details  Name: Deborah Thornton MRN: [id number] Date of Birth: Feb 21, 1946   Medicare Important Message Given:  Yes     Dannette Barbara 08/07/2020, 12:19 PM

## 2020-08-07 NOTE — Discharge Summary (Addendum)
Physician Discharge Summary  Deborah Thornton YKD:[id number] DOB: [birth year] DOA: 08-24-2020  PCP: Merryl Hacker, No  Admit date: 2020/08/24 Discharge date: 08/07/2020  Discharge disposition: Hospice home   Recommendations for Outpatient Follow-Up:   Follow-up with hospice team at the hospice home   Discharge Diagnosis:   Principal Problem:   Pancreatic mass Active Problems:   Essential hypertension   Obstructive jaundice   Acute hyponatremia   Hypokalemia   Acquired hypothyroidism   Protein-calorie malnutrition, severe   Pressure injury of skin, stage II coccygeal decubitus ulcer    Discharge Condition: Stable.  Diet recommendation:  Diet Order            Diet general           Diet regular Room service appropriate? Yes; Fluid consistency: Thin  Diet effective now                   Code Status: DNR     Hospital Course:   Ms. Deborah Thornton is a 75 y.o. female femalewith medical history significant fortype 2 diabetes mellitus with most recent hemoglobin A1c 11.8% in December 2021, Alzheimer's dementia, hyperlipidemia, acquired hypothyroidism, hypertension,who was referred by her PCP to the emergency department because of jaundice and low blood pressure.  Her husband reported that patient had been eating poorly and had loss significant amount of weight.  She was hypotensive in the hospital and was treated with IV fluids.  She was also found to have pancreatic mass, concerning for pancreatic cancer, with elevated liver enzymes and obstructive jaundice.  CA 19-9 was elevated at 12,702.  She had electrolyte abnormalities including hyponatremia, hypomagnesemia and hypokalemia that were treated.  She was seen in consultation by the gastroenterologist.  She underwent ERCP with biliary stent placement on 08/12/2020.  Given that overall clinical picture connotes a poor prognosis, her husband opted for comfort measures with hospice.  She was transitioned to comfort care.  She was  evaluated by the hospice team.  Per hospice team, patient is a candidate for comfort measures at the hospice home.  She will be discharged to hospice home today for comfort measures.   Medical Consultants:    Gastroenterologist   Discharge Exam:    Vitals:   08/06/20 0352 08/06/20 0803 08/06/20 1941 08/07/20 0731  BP: 111/65 102/62 98/69 104/67  Pulse: 84 85 78 75  Resp: 16 16 20    Temp: 98 F (36.7 C) 98.6 F (37 C) 97.9 F (36.6 C) 98.6 F (37 C)  TempSrc: Oral Oral Oral Oral  SpO2: 100% 100% 100% 99%  Weight:      Height:         GEN: NAD, cachetic SKIN: Jaundice.  Stage II right coccygeal decubitus ulcer EYES: EOMI, icteric ENT: MMM CV: RRR PULM: CTA B ABD: soft, ND, NT, +BS CNS: AAO x 1 (person), non focal EXT: B/l pedal edema, no tenderness    Pressure Injury 07/31/2020 Coccyx Right Stage 2 -  Partial thickness loss of dermis presenting as a shallow open injury with a red, pink wound bed without slough. pale pink measres .5cm x.5cm (Active)  08/17/2020 1510  Location: Coccyx  Location Orientation: Right  Staging: Stage 2 -  Partial thickness loss of dermis presenting as a shallow open injury with a red, pink wound bed without slough.  Wound Description (Comments): pale pink measres .5cm x.5cm  Present on Admission:          The results of significant diagnostics from this  hospitalization (including imaging, microbiology, ancillary and laboratory) are listed below for reference.     Procedures and Diagnostic Studies:   DG Skull 1-3 Views  Result Date: 08/02/2020 CLINICAL DATA:  MRI clearance, altered mental status EXAM: SKULL - 1-3 VIEW COMPARISON:  None. FINDINGS: No unexpected retained radiopaque metallic foreign body seen within the calvarium and within the facial structures. The paranasal sinuses are clear. No definite facial fracture. IMPRESSION: No unexpected retained metallic foreign body. Electronically Signed   By: Fidela Salisbury MD   On:  08/02/2020 01:51   DG Chest 1 View  Result Date: 08/02/2020 CLINICAL DATA:  Pancreatic mass, screening for MRI EXAM: CHEST  1 VIEW COMPARISON:  07/06/2020 FINDINGS: Single frontal view of the chest demonstrates an unremarkable cardiac silhouette. No airspace disease, effusion, or pneumothorax. No acute bony abnormalities. No radiopaque foreign bodies from the base of the neck through the upper abdomen. IMPRESSION: 1. No acute intrathoracic process. Electronically Signed   By: Randa Ngo M.D.   On: 08/02/2020 01:49   MR ABDOMEN W WO CONTRAST  Result Date: 08/02/2020 CLINICAL DATA:  75 year old female with history of soft tissue mass in the pancreatic head noted on prior CT examination, concerning for pancreatic neoplasm. Follow-up study. EXAM: MRI ABDOMEN WITHOUT AND WITH CONTRAST TECHNIQUE: Multiplanar multisequence MR imaging of the abdomen was performed both before and after the administration of intravenous contrast. CONTRAST:  61mL GADAVIST GADOBUTROL 1 MMOL/ML IV SOLN COMPARISON:  CT the abdomen and pelvis 08/20/2020. FINDINGS: Comment: Portions of today's examination are significantly limited by extensive patient respiratory motion. Lower chest: Unremarkable. Hepatobiliary: No discrete cystic or solid hepatic lesions. Severe intra and extrahepatic biliary ductal dilatation. Common bile duct measures 1.6 cm in diameter in the porta hepatis. Gallbladder is severely distended. Gallbladder wall does not appear thickened. There are several small filling defects within the dependent portion of the gallbladder, compatible with tiny gallstones. No definite filling defect in the common bile duct to suggest choledocholithiasis. However, there is abrupt truncation of the common bile duct distally related to a apparent mass in the head of the pancreas (discussed below). Pancreas: In the head of the pancreas there is a poorly defined mass which is T1 isointense, slightly T2 hyperintense, and hypovascular on post  gadolinium imaging measuring approximately 2.2 x 1.7 x 2.0 cm (axial image 50 of series 27 and coronal image 38 of series 31). This is causing mass effect upon adjacent structures, most notably causing complete or near complete obstruction of the common bile duct which is markedly dilated proximally. In addition, MRCP images demonstrate severe dilatation of the pancreatic duct which measures up to 6 mm in diameter, as well as extensive side branch ectasia throughout the pancreas. This lesion also demonstrates diffusion restriction on diffusion-weighted images. This lesion comes in close proximity 2 but appears separate from the superior mesenteric artery, as there appears to be an intact intervening fat plane. However, the lesion comes in contact with and significantly narrows the superior mesenteric vein, spleno portal vein and splenic vein. Portal vein is patent. Celiac axis and common hepatic artery appear separate from the lesion. Small amount of peripancreatic T2 signal intensity suggestive of acute pancreatitis. No well-defined peripancreatic fluid collections to suggest pseudocyst at this time. Spleen: Multiple subcentimeter T1 hypointense, T2 hyperintense nonenhancing or hypovascular lesions scattered throughout the spleen, difficult to characterize (likely to represent a combination of tiny cysts and hemangiomas). Adrenals/Urinary Tract: Subcentimeter T1 hypointense, T2 hyperintense nonenhancing lesion in the interpolar region of the  left kidney is compatible with a small simple cyst. Right kidney and bilateral adrenal glands are normal in appearance. No hydroureteronephrosis in the visualized portions of the abdomen. Stomach/Bowel: Visualized portions are unremarkable. Vascular/Lymphatic: Aortic atherosclerosis. Vascular findings pertinent to probable pancreatic malignancy, as detailed above. No definite lymphadenopathy confidently identified in the abdomen on today's motion limited examination. Other:  Trace volume of ascites in the pericolic gutters. Retroperitoneal edema, presumably reflective of pancreatitis. Musculoskeletal: No aggressive appearing osseous lesions are noted in the visualized portions of the skeleton. IMPRESSION: 1. 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 This is associated with obstruction of the common bile duct as well as the distal pancreatic duct. This appears to be associated with some acute pancreatitis at this time. This lesion is intimately associated with the superior mesenteric vein, splenoportal confluence and splenic vein, but no definite arterial involvement at this time. Further evaluation with endoscopic ultrasound and consideration for biopsy is recommended. 2. No definitive metastatic disease identified in the abdomen. Electronically Signed   By: Vinnie Langton M.D.   On: 08/02/2020 06:55   CT ABDOMEN PELVIS W CONTRAST  Result Date: 07/29/2020 CLINICAL DATA:  Hypotension and jaundice. EXAM: CT ABDOMEN AND PELVIS WITH CONTRAST TECHNIQUE: Multidetector CT imaging of the abdomen and pelvis was performed using the standard protocol following bolus administration of intravenous contrast. CONTRAST:  63mL OMNIPAQUE IOHEXOL 300 MG/ML  SOLN COMPARISON:  August 10, 2007 FINDINGS: Lower chest: A small pericardial effusion is seen. Hepatobiliary: No focal liver abnormality is seen. There is marked severity, predominant central intrahepatic biliary dilatation. Subcentimeter gallstones are seen within the dependent portion of a markedly distended gallbladder. The common bile duct is dilated and measures approximately 1.8 cm. Pancreas: A 2.7 cm x 1.9 cm x 2.1 cm low-attenuation soft tissue mass is seen within the pancreatic head. This represents a new finding when compared to the prior study. Dilatation of the pancreatic duct is also noted (6.1 mm). Spleen: Multiple  subcentimeter ill-defined foci of parenchymal low attenuation are seen scattered throughout the spleen. These areas are mildly increased in number when compared to the prior study. An additional 1.2 cm x 1.0 cm ill-defined low-attenuation splenic lesion is seen (axial CT image 17, CT series number 2). Adrenals/Urinary Tract: Adrenal glands are unremarkable. Kidneys are normal in size, without renal calculi or hydronephrosis. A 6 mm cystic appearing area is seen within the mid left kidney. Bladder is unremarkable. Stomach/Bowel: Stomach is within normal limits. The appendix is not clearly visualized. No evidence of bowel dilatation. Noninflamed diverticula are seen throughout the large bowel. Mild diffuse colonic wall thickening is seen, however, it should be noted that the majority of the large bowel is decompressed. Vascular/Lymphatic: Aortic atherosclerosis. No enlarged abdominal or pelvic lymph nodes. Reproductive: Uterus and bilateral adnexa are unremarkable. Other: No abdominal wall hernia or abnormality. A mild amount of abdominal and pelvic free fluid is seen. Musculoskeletal: Degenerative changes seen throughout the lumbar spine. IMPRESSION: 1. Soft tissue mass within the pancreatic head which represents a new finding when compared to the prior study and is concerning for the presence of a pancreatic neoplasm. MRI correlation is recommended. 2. Marked severity, predominant central intrahepatic biliary dilatation, as well as dilatation of the common bile duct and pancreatic duct. 3. Cholelithiasis. 4. Mild amount of abdominal and pelvic free fluid. 5. Multiple predominantly subcentimeter splenic lesions, as described above. MRI correlation is recommended. Reference:  J Am Coll Radiol [phone]. 6. Colonic diverticulosis. 7. Aortic atherosclerosis. Aortic Atherosclerosis (ICD10-I70.0). Electronically Signed   By: Virgina Norfolk M.D.   On: 08/15/2020 19:49   US Abdomen Limited RUQ (LIVER/GB)  Result  Date: 08/08/2020 CLINICAL DATA:  Jaundice and elevated LFTs EXAM: ULTRASOUND ABDOMEN LIMITED RIGHT UPPER QUADRANT COMPARISON:  None. FINDINGS: Gallbladder: Gallbladder is well distended with gallstones and gallbladder sludge within. No wall thickness or pericholecystic fluid is noted. Common bile duct: Diameter: 14 mm Liver: Intrahepatic biliary ductal dilatation is noted. Mild increased echogenicity is noted consistent with fatty infiltration. No mass lesion is seen. Portal vein is patent on color Doppler imaging with normal direction of blood flow towards the liver. Other: Imaging of the pancreas was performed given the dilatation of the common bile duct. There is prominence of the pancreatic duct is well although the head and uncinate process for not well visualized. IMPRESSION: Gallbladder sludge and gallstones. Extrahepatic and intrahepatic biliary ductal dilatation. Given the clinical findings, CT of the abdomen and pelvis with contrast is recommended for further evaluation. Electronically Signed   By: Inez Catalina M.D.   On: 08/21/2020 16:44     Labs:   Basic Metabolic Panel: Recent Labs  Lab 08/06/2020 1451 08/02/20 0730 08/03/20 0430 07/31/2020 0313 08/05/20 0625  NA 129* 129* 132* 133* 136  K 3.4* 4.8 4.5 3.4* 3.9  CL 89* 94* 98 99 104  CO2 24 23 20* 25 24  GLUCOSE 204* 199* 173* 194* 130*  BUN 15 13 11 11 9   CREATININE <0.30* RESULTS UNAVAILABLE DUE TO INTERFERING SUBSTANCE <0.30* <0.30* 0.50  CALCIUM 9.4 9.0 8.8* 8.5* 8.3*  MG  --  1.8  1.9  --  1.6* 2.0  PHOS  --   --   --  UNABLE TO REPORT DUE TO ICTERUS  --    GFR Estimated Creatinine Clearance: 38 mL/min (by C-G formula based on SCr of 0.5 mg/dL). Liver Function Tests: Recent Labs  Lab 08/21/2020 1451 08/02/20 0730 08/03/20 0430 08/05/20 0746  AST 207* 139* 113* 98*  ALT 241* RESULTS UNAVAILABLE DUE TO INTERFERING SUBSTANCE 147* 126*  ALKPHOS 817* 654* 537* 514*  BILITOT 22.5* 18.4* 16.6* 9.2*  PROT 7.3 5.9* 5.1* 4.9*   ALBUMIN 3.3* 2.5* 2.3* 2.0*   Recent Labs  Lab 08/26/2020 1451  LIPASE 29   No results for input(s): AMMONIA in the last 168 hours. Coagulation profile Recent Labs  Lab 08/02/20 0730  INR 1.1    CBC: Recent Labs  Lab 08/10/2020 1451 08/02/20 0730 08/05/20 0625  WBC 7.3  7.2 6.6 4.6  NEUTROABS 6.1  --  3.7  HGB 13.7  13.6 14.1 11.7*  HCT 40.1  40.1 40.6 33.9*  MCV 93.5  93.7 91.9 93.1  PLT 195  187 198 184   Cardiac Enzymes: No results for input(s): CKTOTAL, CKMB, CKMBINDEX, TROPONINI in the last 168 hours. BNP: Invalid input(s): POCBNP CBG: Recent Labs  Lab 08/21/2020 1216 08/13/2020 1836 08/03/2020 2350 08/05/20 0505 08/05/20 1209  GLUCAP 245* 236* 182* 135* 139*   D-Dimer No results for input(s): DDIMER in the last 72 hours. Hgb A1c No results for input(s): HGBA1C in the last 72 hours. Lipid Profile No results for input(s): CHOL, HDL, LDLCALC, TRIG, CHOLHDL, LDLDIRECT in the last 72 hours. Thyroid function studies No results for input(s): TSH, T4TOTAL, T3FREE, THYROIDAB in the last 72 hours.  Invalid input(s): FREET3 Anemia work up No results for input(s): VITAMINB12, FOLATE, FERRITIN, TIBC, IRON, RETICCTPCT in the last  72 hours. Microbiology Recent Results (from the past 240 hour(s))  Resp Panel by RT-PCR (Flu A&B, Covid) Nasopharyngeal Swab     Status: None   Collection Time: 08/26/2020 10:04 PM   Specimen: Nasopharyngeal Swab; Nasopharyngeal(NP) swabs in vial transport medium  Result Value Ref Range Status   SARS Coronavirus 2 by RT PCR NEGATIVE NEGATIVE Final    Comment: (NOTE) SARS-CoV-2 target nucleic acids are NOT DETECTED.  The SARS-CoV-2 RNA is generally detectable in upper respiratory specimens during the acute phase of infection. The lowest concentration of SARS-CoV-2 viral copies this assay can detect is 138 copies/mL. A negative result does not preclude SARS-Cov-2 infection and should not be used as the sole basis for treatment or other  patient management decisions. A negative result may occur with  improper specimen collection/handling, submission of specimen other than nasopharyngeal swab, presence of viral mutation(s) within the areas targeted by this assay, and inadequate number of viral copies(<138 copies/mL). A negative result must be combined with clinical observations, patient history, and epidemiological information. The expected result is Negative.  Fact Sheet for Patients:  EntrepreneurPulse.com.au  Fact Sheet for Healthcare Providers:  IncredibleEmployment.be  This test is no t yet approved or cleared by the [place] FDA and  has been authorized for detection and/or diagnosis of SARS-CoV-2 by FDA under an Emergency Use Authorization (EUA). This EUA will remain  in effect (meaning this test can be used) for the duration of the COVID-19 declaration under Section 564(b)(1) of the Act, 21 U.S.C.section 360bbb-3(b)(1), unless the authorization is terminated  or revoked sooner.       Influenza A by PCR NEGATIVE NEGATIVE Final   Influenza B by PCR NEGATIVE NEGATIVE Final    Comment: (NOTE) The Xpert Xpress SARS-CoV-2/FLU/RSV plus assay is intended as an aid in the diagnosis of influenza from Nasopharyngeal swab specimens and should not be used as a sole basis for treatment. Nasal washings and aspirates are unacceptable for Xpert Xpress SARS-CoV-2/FLU/RSV testing.  Fact Sheet for Patients: EntrepreneurPulse.com.au  Fact Sheet for Healthcare Providers: IncredibleEmployment.be  This test is not yet approved or cleared by the [place] FDA and has been authorized for detection and/or diagnosis of SARS-CoV-2 by FDA under an Emergency Use Authorization (EUA). This EUA will remain in effect (meaning this test can be used) for the duration of the COVID-19 declaration under Section 564(b)(1) of the Act, 21 U.S.C. section  360bbb-3(b)(1), unless the authorization is terminated or revoked.  Performed at [facility], [address] [place], [place] [address]      Discharge Instructions:   Discharge Instructions    Diet general   Complete by: As directed    Increase activity slowly   Complete by: As directed    No wound care   Complete by: As directed      Allergies as of 08/07/2020      Reactions   Penicillin G Other (See Comments)   Rofecoxib Other (See Comments)   Sulfa Antibiotics Other (See Comments)      Medication List    STOP taking these medications   aspirin EC 81 MG tablet   cholecalciferol 25 MCG (1000 UNIT) tablet Commonly known as: VITAMIN D   diltiazem 120 MG 24 hr capsule Commonly known as: TIAZAC   donepezil 10 MG tablet Commonly known as: ARICEPT   insulin aspart 100 UNIT/ML injection Commonly known as: novoLOG   insulin glargine 100 UNIT/ML injection Commonly known as: LANTUS   levothyroxine 25 MCG tablet Commonly  known as: SYNTHROID   lisinopril 10 MG tablet Commonly known as: ZESTRIL   metFORMIN 500 MG tablet Commonly known as: GLUCOPHAGE   multivitamin with minerals tablet   simvastatin 40 MG tablet Commonly known as: ZOCOR         Time coordinating discharge: 31 minutes  Signed:  Symiah Nowotny  [facility] 08/07/2020, 12:14 PM   Pager on [email or web address]. If 7PM-7AM, please contact night-coverage at [email or web address]

## 2020-08-07 NOTE — TOC Transition Note (Addendum)
Transition of Care [facility]) - CM/SW Discharge Note   Patient Details  Name: Deborah Thornton MRN: [id number] Date of Birth: 09-08-1945  Transition of Care [facility]) CM/SW Contact:  Candie Chroman, LCSW Phone Number: 08/07/2020, 11:55 AM   Clinical Narrative:  Patient has orders to discharge to the [facility] today. Hospice liaison will call report to the facility. Transport set up with Hospital doctor for 4:30. Hospice liaison said husband is aware. No further concerns. CSW signing off.  12:42 pm: Hospice facility wants rapid COVID done before she leaves since husband is positive. MD has ordered.   3:29 pm: Patient tested positive for COVID. Per [facility] liaison, patient would not be able to admit until 21 day isolation that hospital requires is complete. Husband is aware. Discussed hospice facilities in other counties. He is agreeable. [facility] [place] is able to accept COVID patients. They are reviewing referral. Husband is aware.  4:39 pm: Received call from [facility] [place]. They are reviewing referral for the [facility] facility. Juliann Pulse has spoken to patient's husband and he is in agreement.  Final next level of care: [place] Barriers to Discharge: Barriers Resolved   Patient Goals and CMS Choice     Choice offered to / list presented to : Spouse  Discharge Placement              Patient chooses bed at: Other - please specify in the comment section below: [facility]) Patient to be transferred to facility by: [facility]   Patient and family notified of of transfer: 08/07/20  Discharge Plan and Services                                     Social Determinants of Health (SDOH) Interventions     Readmission Risk Interventions No flowsheet data found.

## 2020-08-07 NOTE — Progress Notes (Addendum)
[place] Room [address] [facility]) Hospital Liaison RN note:  Received request from Deborah Thornton, [facility] for family interest in [place]. Chart is under review and eligibility has been approved. Spoke with spouse, Deborah Thornton to confirm interest, explain services and initiate education related to hospice philosophy.   Hospice Home does have a bed to offer today and registration paper work to be completed by Deborah Thornton via docusign today. Hospital care team is aware and [place] Scrape, TOC to arrange transportation for 4:30pm.  Please call with any hospice related questions or concerns.  Thank you for the opportunity to participate in this patient's care.  Zandra Abts, RN [facility] Liaison [phone]  ADDENDUM: Patient did test positive for Covid. Hospice Home is not able to accept a Covid positive patient. Spoke with spouse to provide update. Will continue to monitor to see if [facility] can assist with discharge planning needs.

## 2020-08-07 NOTE — Progress Notes (Signed)
Due to positive COVID test patient will not be discharging to [place]. Dr. Mal Elvena Oyer and [facility] aware.   Fuller Mandril, RN

## 2020-08-08 DIAGNOSIS — K8689 Other specified diseases of pancreas: Secondary | ICD-10-CM | POA: Diagnosis not present

## 2020-08-08 DIAGNOSIS — U071 COVID-19: Secondary | ICD-10-CM

## 2020-08-08 DIAGNOSIS — E43 Unspecified severe protein-calorie malnutrition: Secondary | ICD-10-CM | POA: Diagnosis not present

## 2020-08-08 NOTE — Progress Notes (Addendum)
Progress Note    Deborah Thornton  PJK:[id number] DOB: 1946/02/02  DOA: 08/22/2020 PCP: Pcp, No      Brief Narrative:    Medical records reviewed and are as summarized below:   Ms. Deborah Thornton a 75 y.o.femalefemalewith medical history significant fortype 2 diabetes mellitus with most recent hemoglobin A1c 11.8% in December 2021, Alzheimer's dementia, hyperlipidemia, acquired hypothyroidism, hypertension,who was referred by her PCP to the emergency department because of jaundice and low blood pressure.  Her husband reported that patient had been eating poorly and had loss significant amount of weight.  She was hypotensive in the hospital and was treated with IV fluids.  She was also found to have pancreatic mass, concerning for pancreatic cancer, with elevated liver enzymes and obstructive jaundice.  CA 19-9 was elevated at 12,702.  She had electrolyte abnormalities including hyponatremia, hypomagnesemia and hypokalemia that were treated.  She was seen in consultation by the gastroenterologist.  She underwent ERCP with biliary stent placement on 08/15/20.  She tested positive for COVID-19 infection.  Given that overall clinical picture connotes a poor prognosis, her husband opted for comfort measures with hospice.  She was transitioned to comfort care.  She was evaluated by the hospice team.  Per hospice team, patient is a candidate for comfort measures at the hospice home.     Assessment/Plan:   Principal Problem:   Pancreatic mass Active Problems:   Essential hypertension   Obstructive jaundice   Acute hyponatremia   Hypokalemia   Acquired hypothyroidism   Protein-calorie malnutrition, severe   Pressure injury of skin, stage II coccygeal decubitus ulcer   COVID-19 virus infection    Body mass index is 16.5 kg/m.  Underweight, severe malnutrition, encourage adequate oral intake.  Follow-up with dietitian.   Pancreatic mass with elevated liver enzymes and  obstructive jaundice Hypotension Hyponatremia Hypokalemia Hypomagnesemia Type 2 diabetes mellitus Alzheimer's dementia Hypothyroidism Underweight, severe protein calorie malnutrition COVID-19 infection  PLAN   S/p ERCP with biliary stent placement on 15-Aug-2020  Continue comfort measures.  Awaiting placement to hospice house    Diet Order            Diet general           Diet regular Room service appropriate? Yes; Fluid consistency: Thin  Diet effective now                    Consultants:  Gastroenterologist  Procedures:  ERCP with biliary stent placement on 08/15/2020    Medications:   . feeding supplement (NEPRO CARB STEADY)  237 mL Oral BID BM   Continuous Infusions:    Anti-infectives (From admission, onward)   None             Family Communication/Anticipated D/C date and plan/Code Status   DVT prophylaxis: SCDs Start: 08/02/20 0035     Code Status: DNR  Family Communication: Discussed plan of care with her husband Disposition Plan:    Status is: Inpatient  Remains inpatient appropriate because:Unsafe d/c plan   Dispo: The patient is from: Home              Anticipated d/c is to: Home              Anticipated d/c date is: 3 days              Patient currently is medically stable to d/c.           Subjective:  Interval events noted.  Objective:    Vitals:   08/06/20 0803 08/06/20 1941 08/07/20 0731 08/07/20 1941  BP: 102/62 98/69 104/67 112/64  Pulse: 85 78 75 69  Resp: 16 20  18   Temp: 98.6 F (37 C) 97.9 F (36.6 C) 98.6 F (37 C) 97.9 F (36.6 C)  TempSrc: Oral Oral Oral Oral  SpO2: 100% 100% 99% 99%  Weight:      Height:       No data found.   Intake/Output Summary (Last 24 hours) at 08/08/2020 1522 Last data filed at 08/08/2020 0644 Gross per 24 hour  Intake 100 ml  Output --  Net 100 ml   Filed Weights   08/25/2020 1401 08/02/20 0500  Weight: 40.8 kg 39.6 kg    Exam:  GEN:  NAD SKIN: Poor skin turgor stage II right coccygeal decubitus ulcer. EYES: Icteric. ENT: MMM CV: RRR PULM: CTA B ABD: soft, ND, NT, +BS CNS: AAO x 1, non focal EXT: No edema or tenderness      Pressure Injury 08/21/2020 Coccyx Right Stage 2 -  Partial thickness loss of dermis presenting as a shallow open injury with a red, pink wound bed without slough. pale pink measres .5cm x.5cm (Active)  08/11/2020 1510  Location: Coccyx  Location Orientation: Right  Staging: Stage 2 -  Partial thickness loss of dermis presenting as a shallow open injury with a red, pink wound bed without slough.  Wound Description (Comments): pale pink measres .5cm x.5cm  Present on Admission:             Data Reviewed:   I have personally reviewed following labs and imaging studies:  Labs: Labs show the following:   Basic Metabolic Panel: Recent Labs  Lab 08/02/20 0730 08/03/20 0430 08/10/2020 0313 08/05/20 0625  NA 129* 132* 133* 136  K 4.8 4.5 3.4* 3.9  CL 94* 98 99 104  CO2 23 20* 25 24  GLUCOSE 199* 173* 194* 130*  BUN 13 11 11 9   CREATININE RESULTS UNAVAILABLE DUE TO INTERFERING SUBSTANCE <0.30* <0.30* 0.50  CALCIUM 9.0 8.8* 8.5* 8.3*  MG 1.8  1.9  --  1.6* 2.0  PHOS  --   --  UNABLE TO REPORT DUE TO ICTERUS  --    GFR Estimated Creatinine Clearance: 38 mL/min (by C-G formula based on SCr of 0.5 mg/dL). Liver Function Tests: Recent Labs  Lab 08/02/20 0730 08/03/20 0430 08/05/20 0746  AST 139* 113* 98*  ALT RESULTS UNAVAILABLE DUE TO INTERFERING SUBSTANCE 147* 126*  ALKPHOS 654* 537* 514*  BILITOT 18.4* 16.6* 9.2*  PROT 5.9* 5.1* 4.9*  ALBUMIN 2.5* 2.3* 2.0*   No results for input(s): LIPASE, AMYLASE in the last 168 hours. No results for input(s): AMMONIA in the last 168 hours. Coagulation profile Recent Labs  Lab 08/02/20 0730  INR 1.1    CBC: Recent Labs  Lab 08/02/20 0730 08/05/20 0625  WBC 6.6 4.6  NEUTROABS  --  3.7  HGB 14.1 11.7*  HCT 40.6 33.9*  MCV  91.9 93.1  PLT 198 184   Cardiac Enzymes: No results for input(s): CKTOTAL, CKMB, CKMBINDEX, TROPONINI in the last 168 hours. BNP (last 3 results) No results for input(s): PROBNP in the last 8760 hours. CBG: Recent Labs  Lab 08/25/2020 1216 08/28/2020 1836 08/18/2020 2350 08/05/20 0505 08/05/20 1209  GLUCAP 245* 236* 182* 135* 139*   D-Dimer: No results for input(s): DDIMER in the last 72 hours. Hgb A1c: No results for input(s): HGBA1C  in the last 72 hours. Lipid Profile: No results for input(s): CHOL, HDL, LDLCALC, TRIG, CHOLHDL, LDLDIRECT in the last 72 hours. Thyroid function studies: No results for input(s): TSH, T4TOTAL, T3FREE, THYROIDAB in the last 72 hours.  Invalid input(s): FREET3 Anemia work up: No results for input(s): VITAMINB12, FOLATE, FERRITIN, TIBC, IRON, RETICCTPCT in the last 72 hours. Sepsis Labs: Recent Labs  Lab 08/02/20 0730 08/05/20 0625  WBC 6.6 4.6    Microbiology Recent Results (from the past 240 hour(s))  Resp Panel by RT-PCR (Flu A&B, Covid) Nasopharyngeal Swab     Status: None   Collection Time: 08/21/2020 10:04 PM   Specimen: Nasopharyngeal Swab; Nasopharyngeal(NP) swabs in vial transport medium  Result Value Ref Range Status   SARS Coronavirus 2 by RT PCR NEGATIVE NEGATIVE Final    Comment: (NOTE) SARS-CoV-2 target nucleic acids are NOT DETECTED.  The SARS-CoV-2 RNA is generally detectable in upper respiratory specimens during the acute phase of infection. The lowest concentration of SARS-CoV-2 viral copies this assay can detect is 138 copies/mL. A negative result does not preclude SARS-Cov-2 infection and should not be used as the sole basis for treatment or other patient management decisions. A negative result may occur with  improper specimen collection/handling, submission of specimen other than nasopharyngeal swab, presence of viral mutation(s) within the areas targeted by this assay, and inadequate number of viral copies(<138  copies/mL). A negative result must be combined with clinical observations, patient history, and epidemiological information. The expected result is Negative.  Fact Sheet for Patients:  EntrepreneurPulse.com.au  Fact Sheet for Healthcare Providers:  IncredibleEmployment.be  This test is no t yet approved or cleared by the [place] FDA and  has been authorized for detection and/or diagnosis of SARS-CoV-2 by FDA under an Emergency Use Authorization (EUA). This EUA will remain  in effect (meaning this test can be used) for the duration of the COVID-19 declaration under Section 564(b)(1) of the Act, 21 U.S.C.section 360bbb-3(b)(1), unless the authorization is terminated  or revoked sooner.       Influenza A by PCR NEGATIVE NEGATIVE Final   Influenza B by PCR NEGATIVE NEGATIVE Final    Comment: (NOTE) The Xpert Xpress SARS-CoV-2/FLU/RSV plus assay is intended as an aid in the diagnosis of influenza from Nasopharyngeal swab specimens and should not be used as a sole basis for treatment. Nasal washings and aspirates are unacceptable for Xpert Xpress SARS-CoV-2/FLU/RSV testing.  Fact Sheet for Patients: EntrepreneurPulse.com.au  Fact Sheet for Healthcare Providers: IncredibleEmployment.be  This test is not yet approved or cleared by the [place] FDA and has been authorized for detection and/or diagnosis of SARS-CoV-2 by FDA under an Emergency Use Authorization (EUA). This EUA will remain in effect (meaning this test can be used) for the duration of the COVID-19 declaration under Section 564(b)(1) of the Act, 21 U.S.C. section 360bbb-3(b)(1), unless the authorization is terminated or revoked.  Performed at [facility], [address] [place],  [address]   Resp Panel by RT-PCR (Flu A&B, Covid) Nasopharyngeal Swab     Status: Abnormal   Collection Time: 08/07/20  1:33 PM   Specimen:  Nasopharyngeal Swab; Nasopharyngeal(NP) swabs in vial transport medium  Result Value Ref Range Status   SARS Coronavirus 2 by RT PCR POSITIVE (A) NEGATIVE Final    Comment: RESULT CALLED TO, READ BACK BY AND VERIFIED WITH: SHEREA COVINGTON ADAMS 08/07/20 AT 1437 BY ACR (NOTE) SARS-CoV-2 target nucleic acids are DETECTED.  The SARS-CoV-2 RNA is generally detectable in upper  respiratory specimens during the acute phase of infection. Positive results are indicative of the presence of the identified virus, but do not rule out bacterial infection or co-infection with other pathogens not detected by the test. Clinical correlation with patient history and other diagnostic information is necessary to determine patient infection status. The expected result is Negative.  Fact Sheet for Patients: EntrepreneurPulse.com.au  Fact Sheet for Healthcare Providers: IncredibleEmployment.be  This test is not yet approved or cleared by the [place] FDA and  has been authorized for detection and/or diagnosis of SARS-CoV-2 by FDA under an Emergency Use Authorization (EUA).  This EUA will remain in effect (meaning this  test can be used) for the duration of  the COVID-19 declaration under Section 564(b)(1) of the Act, 21 U.S.C. section 360bbb-3(b)(1), unless the authorization is terminated or revoked sooner.     Influenza A by PCR NEGATIVE NEGATIVE Final   Influenza B by PCR NEGATIVE NEGATIVE Final    Comment: (NOTE) The Xpert Xpress SARS-CoV-2/FLU/RSV plus assay is intended as an aid in the diagnosis of influenza from Nasopharyngeal swab specimens and should not be used as a sole basis for treatment. Nasal washings and aspirates are unacceptable for Xpert Xpress SARS-CoV-2/FLU/RSV testing.  Fact Sheet for Patients: EntrepreneurPulse.com.au  Fact Sheet for Healthcare Providers: IncredibleEmployment.be  This test is not  yet approved or cleared by the [place] FDA and has been authorized for detection and/or diagnosis of SARS-CoV-2 by FDA under an Emergency Use Authorization (EUA). This EUA will remain in effect (meaning this test can be used) for the duration of the COVID-19 declaration under Section 564(b)(1) of the Act, 21 U.S.C. section 360bbb-3(b)(1), unless the authorization is terminated or revoked.  Performed at [facility], [address] [place], [place] [address]     Procedures and diagnostic studies:  No results found.             LOS: 7 days   Javarian Jakubiak  [facility]   Pager on [email or web address]. If 7PM-7AM, please contact night-coverage at [email or web address]     08/08/2020, 3:22 PM

## 2020-08-08 NOTE — TOC Progression Note (Addendum)
Transition of Care [facility]) - Progression Note    Patient Details  Name: Deborah Thornton MRN: [id number] Date of Birth: 12-Feb-1946  Transition of Care [facility]) CM/SW Contact  Candie Chroman, LCSW Phone Number: 08/08/2020, 1:50 PM  Clinical Narrative:  Hospice house in [place] unable to accept. Patient does not meet their criteria for hospice. The hospice houses in [place], [place], [place], and [place] do not accept COVID patients.   2:15 pm: SNF with hospice might be patient's best option at this time. Earliest local facilities can take patients is 10 days from positive test. Discussed with husband and he is agreeable. Sent referral out for review and noted that earliest discharge would be is 1/20 or 1/21.    Barriers to Discharge: Barriers Resolved  Expected Discharge Plan and Services           Expected Discharge Date: 08/07/20                                     Social Determinants of Health (SDOH) Interventions    Readmission Risk Interventions No flowsheet data found.

## 2020-08-08 NOTE — NC FL2 (Signed)
  [address] LEVEL OF CARE SCREENING TOOL     IDENTIFICATION  Patient Name: Deborah Thornton Birthdate: Mar 10, 1946 Sex: female Admission Date (Current Location): 08/12/2020  [facility] and [place] Number:  Engineering geologist and Address:  [facility], [address] [place], [place] [address]      Provider Number: [id number]  Attending Physician Name and Address:  Jennye Boroughs, MD  Relative Name and Phone Number:       Current Level of Care: Hospital Recommended Level of Care: [place] (with hospice) Prior Approval Number:    Date Approved/Denied:   PASRR Number: [id number]  Discharge Plan: Other (Comment) (SNF with hospice)    Current Diagnoses: Patient Active Problem List   Diagnosis Date Noted  . COVID-19 virus infection 08/07/2020  . Pressure injury of skin, stage II coccygeal decubitus ulcer 2020-08-27  . Protein-calorie malnutrition, severe 08/03/2020  . Pancreatic mass 08/02/2020  . Acute hyponatremia 08/02/2020  . Hypokalemia 08/02/2020  . Acquired hypothyroidism 08/02/2020  . Obstructive jaundice 07/29/2020  . Diabetic ketoacidosis associated with type 2 diabetes mellitus ([place]) 07/06/2020  . Hyperlipidemia associated with type 2 diabetes mellitus ([place]) 07/06/2020  . Essential hypertension 07/06/2020  . Hypothyroidism 07/06/2020  . Alzheimer's dementia without behavioral disturbance ([place]) 04/09/2017    Orientation RESPIRATION BLADDER Height & Weight     Self  Normal Incontinent Weight: 87 lb 4.8 oz (39.6 kg) Height:  5\' 1"  (154.9 cm)  BEHAVIORAL SYMPTOMS/MOOD NEUROLOGICAL BOWEL NUTRITION STATUS   (None)  (Alzheimer's dementia without behavioral disturbance) Incontinent Diet (Regular)  AMBULATORY STATUS COMMUNICATION OF NEEDS Skin     Verbally Bruising,PU Stage and Appropriate Care   PU Stage 2 Dressing:  (Right coccyx: Allevyn sacral pad)                   Personal Care Assistance Level of  Assistance              Functional Limitations Info  Sight,Hearing,Speech Sight Info: Adequate Hearing Info: Adequate Speech Info: Adequate    SPECIAL CARE FACTORS FREQUENCY                       Contractures Contractures Info: Not present    Additional Factors Info  Code Status,Allergies Code Status Info: DNR Allergies Info: Penicillin G, Rofecoxib, Sulfa Antibiotics           Current Medications (08/08/2020):  This is the current hospital active medication list Current Facility-Administered Medications  Medication Dose Route Frequency Provider Last Rate Last Admin  . feeding supplement (NEPRO CARB STEADY) liquid 237 mL  237 mL Oral BID BM Jennye Boroughs, MD   237 mL at 08/06/20 0840  . LORazepam (ATIVAN) injection 1 mg  1 mg Intravenous Q4H PRN Jennye Boroughs, MD      . morphine 2 MG/ML injection 2 mg  2 mg Intravenous Q4H PRN Jennye Boroughs, MD      . ondansetron (ZOFRAN) injection 4 mg  4 mg Intravenous Q6H PRN Howerter, Justin B, DO         Discharge Medications: Please see discharge summary for a list of discharge medications.  Relevant Imaging Results:  Relevant Lab Results:   Additional Information SS#: [id number]  Candie Chroman, LCSW

## 2020-08-09 DIAGNOSIS — U071 COVID-19: Secondary | ICD-10-CM | POA: Diagnosis not present

## 2020-08-09 DIAGNOSIS — K8689 Other specified diseases of pancreas: Secondary | ICD-10-CM | POA: Diagnosis not present

## 2020-08-09 DIAGNOSIS — K831 Obstruction of bile duct: Secondary | ICD-10-CM | POA: Diagnosis not present

## 2020-08-09 DIAGNOSIS — I10 Essential (primary) hypertension: Secondary | ICD-10-CM | POA: Diagnosis not present

## 2020-08-09 NOTE — Progress Notes (Signed)
Progress Note    Deborah Thornton  [id number] DOB: [birth year]  DOA: 08/12/2020 PCP: Pcp, No      Brief Narrative:    Medical records reviewed and are as summarized below:   Deborah Thornton a 75 y.o.femalefemalewith medical history significant fortype 2 diabetes mellitus with most recent hemoglobin A1c 11.8% in December 2021, Alzheimer's dementia, hyperlipidemia, acquired hypothyroidism, hypertension,who was referred by her PCP to the emergency department because of jaundice and low blood pressure.  Her husband reported that patient had been eating poorly and had loss significant amount of weight.  She was hypotensive in the hospital and was treated with IV fluids.  She was also found to have pancreatic mass, concerning for pancreatic cancer, with elevated liver enzymes and obstructive jaundice.  CA 19-9 was elevated at 12,702.  She had electrolyte abnormalities including hyponatremia, hypomagnesemia and hypokalemia that were treated.  She was seen in consultation by the gastroenterologist.  She underwent ERCP with biliary stent placement on 08/02/2020.  She tested positive for COVID-19 infection.  Given that overall clinical picture connotes a poor prognosis, her husband opted for comfort measures with hospice.  She was transitioned to comfort care.  She was evaluated by the hospice team.  Per hospice team, patient is a candidate for comfort measures at the hospice home.     Assessment/Plan:   Principal Problem:   Pancreatic mass Active Problems:   Essential hypertension   Obstructive jaundice   Acute hyponatremia   Hypokalemia   Acquired hypothyroidism   Protein-calorie malnutrition, severe   Pressure injury of skin, stage II coccygeal decubitus ulcer   COVID-19 virus infection    Body mass index is 16.5 kg/m.  Underweight, severe malnutrition, encourage adequate oral intake.  Follow-up with dietitian.   Pancreatic mass with elevated liver enzymes and  obstructive jaundice Hypotension Hyponatremia Hypokalemia Hypomagnesemia Type 2 diabetes mellitus Alzheimer's dementia Hypothyroidism Underweight, severe protein calorie malnutrition COVID-19 infection  PLAN   S/p ERCP with biliary stent placement on 08/21/2020  S/p biliary stent placement on 08/02/2020.  Continue comfort measures.  Awaiting placement to SNF with hospice versus home with hospice.    Diet Order            Diet general           Diet regular Room service appropriate? Yes; Fluid consistency: Thin  Diet effective now                    Consultants:  Gastroenterologist  Procedures:  ERCP with biliary stent placement on 08/06/2020    Medications:   . feeding supplement (NEPRO CARB STEADY)  237 mL Oral BID BM   Continuous Infusions:    Anti-infectives (From admission, onward)   None             Family Communication/Anticipated D/C date and plan/Code Status   DVT prophylaxis: SCDs Start: 08/02/20 0035     Code Status: DNR  Family Communication: Discussed plan of care with her husband Disposition Plan:    Status is: Inpatient  Remains inpatient appropriate because:Unsafe d/c plan   Dispo: The patient is from: Home              Anticipated d/c is to: Home              Anticipated d/c date is: > 3 days              Patient currently is medically stable to d/c.  Subjective:   No complaints.  She is still confused and does not provide much history.  Interval events noted.  Objective:    Vitals:   08/07/20 0731 08/07/20 1941 08/08/20 1615 08/08/20 2010  BP: 104/67 112/64 117/74 107/74  Pulse: 75 69 69 63  Resp:  18 16   Temp: 98.6 F (37 C) 97.9 F (36.6 C) 97.8 F (36.6 C) 97.8 F (36.6 C)  TempSrc: Oral Oral    SpO2: 99% 99% 100% 100%  Weight:      Height:       No data found.  No intake or output data in the 24 hours ending 08/09/20 1410 Filed Weights   07/31/2020 1401 08/02/20 0500   Weight: 40.8 kg 39.6 kg    Exam:  GEN: NAD SKIN: Jaundice.  Stage II right coccygeal decubitus ulcer EYES: Icteric ENT: MMM CV: RRR PULM: CTA B ABD: soft, ND, NT, +BS CNS: AAO x 1 (person), non focal EXT: No edema or tenderness       Pressure Injury 09/01/20 Coccyx Right Stage 2 -  Partial thickness loss of dermis presenting as a shallow open injury with a red, pink wound bed without slough. pale pink measres .5cm x.5cm (Active)  September 01, 2020 1510  Location: Coccyx  Location Orientation: Right  Staging: Stage 2 -  Partial thickness loss of dermis presenting as a shallow open injury with a red, pink wound bed without slough.  Wound Description (Comments): pale pink measres .5cm x.5cm  Present on Admission:             Data Reviewed:   I have personally reviewed following labs and imaging studies:  Labs: Labs show the following:   Basic Metabolic Panel: Recent Labs  Lab 08/03/20 0430 Sep 01, 2020 0313 08/05/20 0625  NA 132* 133* 136  K 4.5 3.4* 3.9  CL 98 99 104  CO2 20* 25 24  GLUCOSE 173* 194* 130*  BUN 11 11 9   CREATININE <0.30* <0.30* 0.50  CALCIUM 8.8* 8.5* 8.3*  MG  --  1.6* 2.0  PHOS  --  UNABLE TO REPORT DUE TO ICTERUS  --    GFR Estimated Creatinine Clearance: 38 mL/min (by C-G formula based on SCr of 0.5 mg/dL). Liver Function Tests: Recent Labs  Lab 08/03/20 0430 08/05/20 0746  AST 113* 98*  ALT 147* 126*  ALKPHOS 537* 514*  BILITOT 16.6* 9.2*  PROT 5.1* 4.9*  ALBUMIN 2.3* 2.0*   No results for input(s): LIPASE, AMYLASE in the last 168 hours. No results for input(s): AMMONIA in the last 168 hours. Coagulation profile No results for input(s): INR, PROTIME in the last 168 hours.  CBC: Recent Labs  Lab 08/05/20 0625  WBC 4.6  NEUTROABS 3.7  HGB 11.7*  HCT 33.9*  MCV 93.1  PLT 184   Cardiac Enzymes: No results for input(s): CKTOTAL, CKMB, CKMBINDEX, TROPONINI in the last 168 hours. BNP (last 3 results) No results for  input(s): PROBNP in the last 8760 hours. CBG: Recent Labs  Lab September 01, 2020 1216 2020-09-01 1836 2020/09/01 2350 08/05/20 0505 08/05/20 1209  GLUCAP 245* 236* 182* 135* 139*   D-Dimer: No results for input(s): DDIMER in the last 72 hours. Hgb A1c: No results for input(s): HGBA1C in the last 72 hours. Lipid Profile: No results for input(s): CHOL, HDL, LDLCALC, TRIG, CHOLHDL, LDLDIRECT in the last 72 hours. Thyroid function studies: No results for input(s): TSH, T4TOTAL, T3FREE, THYROIDAB in the last 72 hours.  Invalid input(s): FREET3 Anemia work up: No  results for input(s): VITAMINB12, FOLATE, FERRITIN, TIBC, IRON, RETICCTPCT in the last 72 hours. Sepsis Labs: Recent Labs  Lab 08/05/20 0625  WBC 4.6    Microbiology Recent Results (from the past 240 hour(s))  Resp Panel by RT-PCR (Flu A&B, Covid) Nasopharyngeal Swab     Status: None   Collection Time: 08/28/2020 10:04 PM   Specimen: Nasopharyngeal Swab; Nasopharyngeal(NP) swabs in vial transport medium  Result Value Ref Range Status   SARS Coronavirus 2 by RT PCR NEGATIVE NEGATIVE Final    Comment: (NOTE) SARS-CoV-2 target nucleic acids are NOT DETECTED.  The SARS-CoV-2 RNA is generally detectable in upper respiratory specimens during the acute phase of infection. The lowest concentration of SARS-CoV-2 viral copies this assay can detect is 138 copies/mL. A negative result does not preclude SARS-Cov-2 infection and should not be used as the sole basis for treatment or other patient management decisions. A negative result may occur with  improper specimen collection/handling, submission of specimen other than nasopharyngeal swab, presence of viral mutation(s) within the areas targeted by this assay, and inadequate number of viral copies(<138 copies/mL). A negative result must be combined with clinical observations, patient history, and epidemiological information. The expected result is Negative.  Fact Sheet for Patients:   EntrepreneurPulse.com.au  Fact Sheet for Healthcare Providers:  IncredibleEmployment.be  This test is no t yet approved or cleared by the [place] FDA and  has been authorized for detection and/or diagnosis of SARS-CoV-2 by FDA under an Emergency Use Authorization (EUA). This EUA will remain  in effect (meaning this test can be used) for the duration of the COVID-19 declaration under Section 564(b)(1) of the Act, 21 U.S.C.section 360bbb-3(b)(1), unless the authorization is terminated  or revoked sooner.       Influenza A by PCR NEGATIVE NEGATIVE Final   Influenza B by PCR NEGATIVE NEGATIVE Final    Comment: (NOTE) The Xpert Xpress SARS-CoV-2/FLU/RSV plus assay is intended as an aid in the diagnosis of influenza from Nasopharyngeal swab specimens and should not be used as a sole basis for treatment. Nasal washings and aspirates are unacceptable for Xpert Xpress SARS-CoV-2/FLU/RSV testing.  Fact Sheet for Patients: EntrepreneurPulse.com.au  Fact Sheet for Healthcare Providers: IncredibleEmployment.be  This test is not yet approved or cleared by the [place] FDA and has been authorized for detection and/or diagnosis of SARS-CoV-2 by FDA under an Emergency Use Authorization (EUA). This EUA will remain in effect (meaning this test can be used) for the duration of the COVID-19 declaration under Section 564(b)(1) of the Act, 21 U.S.C. section 360bbb-3(b)(1), unless the authorization is terminated or revoked.  Performed at [facility], [address] [place], [place] [address]   Resp Panel by RT-PCR (Flu A&B, Covid) Nasopharyngeal Swab     Status: Abnormal   Collection Time: 08/07/20  1:33 PM   Specimen: Nasopharyngeal Swab; Nasopharyngeal(NP) swabs in vial transport medium  Result Value Ref Range Status   SARS Coronavirus 2 by RT PCR POSITIVE (A) NEGATIVE Final    Comment: RESULT  CALLED TO, READ BACK BY AND VERIFIED WITH: SHEREA COVINGTON ADAMS 08/07/20 AT 1437 BY ACR (NOTE) SARS-CoV-2 target nucleic acids are DETECTED.  The SARS-CoV-2 RNA is generally detectable in upper respiratory specimens during the acute phase of infection. Positive results are indicative of the presence of the identified virus, but do not rule out bacterial infection or co-infection with other pathogens not detected by the test. Clinical correlation with patient history and other diagnostic information is necessary to determine  patient infection status. The expected result is Negative.  Fact Sheet for Patients: EntrepreneurPulse.com.au  Fact Sheet for Healthcare Providers: IncredibleEmployment.be  This test is not yet approved or cleared by the [place] FDA and  has been authorized for detection and/or diagnosis of SARS-CoV-2 by FDA under an Emergency Use Authorization (EUA).  This EUA will remain in effect (meaning this  test can be used) for the duration of  the COVID-19 declaration under Section 564(b)(1) of the Act, 21 U.S.C. section 360bbb-3(b)(1), unless the authorization is terminated or revoked sooner.     Influenza A by PCR NEGATIVE NEGATIVE Final   Influenza B by PCR NEGATIVE NEGATIVE Final    Comment: (NOTE) The Xpert Xpress SARS-CoV-2/FLU/RSV plus assay is intended as an aid in the diagnosis of influenza from Nasopharyngeal swab specimens and should not be used as a sole basis for treatment. Nasal washings and aspirates are unacceptable for Xpert Xpress SARS-CoV-2/FLU/RSV testing.  Fact Sheet for Patients: EntrepreneurPulse.com.au  Fact Sheet for Healthcare Providers: IncredibleEmployment.be  This test is not yet approved or cleared by the [place] FDA and has been authorized for detection and/or diagnosis of SARS-CoV-2 by FDA under an Emergency Use Authorization (EUA). This EUA will  remain in effect (meaning this test can be used) for the duration of the COVID-19 declaration under Section 564(b)(1) of the Act, 21 U.S.C. section 360bbb-3(b)(1), unless the authorization is terminated or revoked.  Performed at [facility], [address] [place], [place] [address]     Procedures and diagnostic studies:  No results found.             LOS: 8 days   Atina Feeley  [facility]   Pager on [email or web address]. If 7PM-7AM, please contact night-coverage at [email or web address]     08/09/2020, 2:10 PM

## 2020-08-09 NOTE — Progress Notes (Signed)
Nutrition Brief Follow-up Note  Chart reviewed.  Pt has now transitioned to comfort care and is awaiting bed at SNF with hospice.  No further nutrition interventions warranted at this time.   Please re-consult as needed.   Lajuan Lines, RD, LDN Clinical Nutrition After Hours/Weekend Pager # in [place]

## 2020-08-10 DIAGNOSIS — K8689 Other specified diseases of pancreas: Secondary | ICD-10-CM | POA: Diagnosis not present

## 2020-08-10 NOTE — Progress Notes (Signed)
[place] at [place] NAME: Deborah Thornton    MR#:  [id number]  DATE OF BIRTH:  09/19/1945  SUBJECTIVE:  Pt up in bed eating food Some baseline confusion No issues per RN REVIEW OF SYSTEMS:   Review of Systems  Unable to perform ROS: Medical condition   Tolerating Diet:yes   DRUG ALLERGIES:   Allergies  Allergen Reactions  . Penicillin G Other (See Comments)  . Rofecoxib Other (See Comments)  . Sulfa Antibiotics Other (See Comments)    VITALS:  Blood pressure 106/70, pulse 67, temperature 97.6 F (36.4 C), temperature source Oral, resp. rate 18, height 5\' 1"  (1.549 m), weight 39.6 kg, SpO2 100 %.  PHYSICAL EXAMINATION:   Physical Exam limited exam-- comfort care  GENERAL:  75 y.o.-year-old patient lying in the bed with no acute distress.  LUNGS: Normal breath sounds bilaterally, no wheezing, rales, rhonchi. No use of accessory muscles of respiration.  CARDIOVASCULAR: S1, S2 normal. No murmurs, rubs, or gallops.  EXTREMITIES: No cyanosis, clubbing or edema b/l.    PSYCHIATRIC:  patient is alert  SKIN: No obvious rash, lesion, or ulcer.    LABORATORY PANEL:  CBC Recent Labs  Lab 08/05/20 0625  WBC 4.6  HGB 11.7*  HCT 33.9*  PLT 184    Chemistries  Recent Labs  Lab 08/05/20 0625 08/05/20 0746  NA 136  --   K 3.9  --   CL 104  --   CO2 24  --   GLUCOSE 130*  --   BUN 9  --   CREATININE 0.50  --   CALCIUM 8.3*  --   MG 2.0  --   AST  --  98*  ALT  --  126*  ALKPHOS  --  514*  BILITOT  --  9.2*   Cardiac Enzymes No results for input(s): TROPONINI in the last 168 hours. RADIOLOGY:  No results found. ASSESSMENT AND PLAN:  Deborah.Deborah A Jordanis a 75 y.o.femalefemalewith medical history significant fortype 2 diabetes mellitus with most recent hemoglobin A1c 11.8% in December 2021, Alzheimer's dementia, hyperlipidemia, acquired hypothyroidism, hypertension,who was referred by her PCP to the emergency  department because of jaundice and low blood pressure.Her husband reported that patient had been eating poorly and had loss significant amount of weight.  Pancreatic mass with elevated liver enzymes and obstructive jaundice Hypotension Hyponatremia Hypokalemia Hypomagnesemia Type 2 diabetes mellitus Alzheimer's dementia Hypothyroidism Underweight, severe protein calorie malnutrition COVID-19 infection  S/p ERCP with biliary stent placement on 08/21/2020 S/p biliary stent placement on 08/14/2020.  Continue comfort measures.  Awaiting placement to SNF with hospice    CODE STATUS: DNR/DNI DVT Prophylaxis :comfort care  Status is: Inpatient  Remains inpatient appropriate because:Unsafe d/c plan   Dispo: The patient is from: Home              Anticipated d/c is to: SNF to be determined              Anticipated d/c date is: > 3 days              Patient currently is under comfort care. 08/09/2020--Per TOC --- SNF with hospice might be patient's best option at this time. Earliest local facilities can take patients is 10 days from positive test. Discussed with husband and he is agreeable. Sent referral out for review and noted that earliest discharge would be is 1/20 or 1/21.       TOTAL TIME TAKING  CARE OF THIS PATIENT: 20 minutes.  >50% time spent on counselling and coordination of care  Note: This dictation was prepared with Dragon dictation along with smaller phrase technology. Any transcriptional errors that result from this process are unintentional.  Fritzi Mandes M.D    [facility]   CC: Primary care physician; Pcp, NoPatient ID: Deborah Thornton, female   DOB: 06/19/1946, 75 y.o.   MRN: [id number]

## 2020-08-11 DIAGNOSIS — K8689 Other specified diseases of pancreas: Secondary | ICD-10-CM | POA: Diagnosis not present

## 2020-08-11 NOTE — Progress Notes (Signed)
[place] at [place] NAME: Deborah Thornton    MR#:  [id number]  DATE OF BIRTH:  11-12-1945  SUBJECTIVE:   Some baseline confusion No issues per RN REVIEW OF SYSTEMS:   Review of Systems  Unable to perform ROS: Medical condition   Tolerating Diet:yes   DRUG ALLERGIES:   Allergies  Allergen Reactions  . Penicillin G Other (See Comments)  . Rofecoxib Other (See Comments)  . Sulfa Antibiotics Other (See Comments)    VITALS:  Blood pressure 120/66, pulse 86, temperature 98 F (36.7 C), temperature source Oral, resp. rate 20, height 5\' 1"  (1.549 m), weight 39.6 kg, SpO2 100 %.  PHYSICAL EXAMINATION:   Physical Exam limited exam-- comfort care  GENERAL:  75 y.o.-year-old patient lying in the bed with no acute distress.  LUNGS: Normal breath sounds bilaterally, no wheezing, rales, rhonchi. No use of accessory muscles of respiration.  CARDIOVASCULAR: S1, S2 normal. No murmurs, rubs, or gallops.  EXTREMITIES: No cyanosis, clubbing or edema b/l.    PSYCHIATRIC:  patient is alert  SKIN: No obvious rash, lesion, or ulcer.    LABORATORY PANEL:  CBC Recent Labs  Lab 08/05/20 0625  WBC 4.6  HGB 11.7*  HCT 33.9*  PLT 184    Chemistries  Recent Labs  Lab 08/05/20 0625 08/05/20 0746  NA 136  --   K 3.9  --   CL 104  --   CO2 24  --   GLUCOSE 130*  --   BUN 9  --   CREATININE 0.50  --   CALCIUM 8.3*  --   MG 2.0  --   AST  --  98*  ALT  --  126*  ALKPHOS  --  514*  BILITOT  --  9.2*   Cardiac Enzymes No results for input(s): TROPONINI in the last 168 hours. RADIOLOGY:  No results found. ASSESSMENT AND PLAN:  [name] a 75 y.o.femalefemalewith medical history significant fortype 2 diabetes mellitus with most recent hemoglobin A1c 11.8% in December 2021, Alzheimer's dementia, hyperlipidemia, acquired hypothyroidism, hypertension,who was referred by her PCP to the emergency department because of  jaundice and low blood pressure.Her husband reported that patient had been eating poorly and had loss significant amount of weight.  Pancreatic mass with elevated liver enzymes and obstructive jaundice Hypotension Hyponatremia Hypokalemia Hypomagnesemia Type 2 diabetes mellitus Alzheimer's dementia Hypothyroidism Underweight, severe protein calorie malnutrition COVID-19 infection  S/p ERCP with biliary stent placement on 08/03/2020 S/p biliary stent placement on 07/29/2020.  Continue comfort measures.  Awaiting placement to SNF with hospice    CODE STATUS: DNR/DNI DVT Prophylaxis :comfort care  Status is: Inpatient  Remains inpatient appropriate because:Unsafe d/c plan   Dispo: The patient is from: Home              Anticipated d/c is to: SNF to be determined              Anticipated d/c date is: > 3 days              Patient currently is under comfort care. 08/09/2020--Per TOC --- SNF with hospice might be patient's best option at this time. Earliest local facilities can take patients is 10 days from positive test. Discussed with husband and he is agreeable. Sent referral out for review and noted that earliest discharge would be is 1/20 or 1/21.       TOTAL TIME TAKING CARE OF THIS PATIENT: 20  minutes.  >50% time spent on counselling and coordination of care  Note: This dictation was prepared with Dragon dictation along with smaller phrase technology. Any transcriptional errors that result from this process are unintentional.  Fritzi Mandes M.D    [facility]   CC: Primary care physician; Pcp, NoPatient ID: Deborah Thornton, female   DOB: 1946-01-17, 75 y.o.   MRN: [id number]

## 2020-08-12 DIAGNOSIS — K8689 Other specified diseases of pancreas: Secondary | ICD-10-CM | POA: Diagnosis not present

## 2020-08-12 NOTE — Progress Notes (Signed)
[place] at [place] NAME: Deborah Thornton    MR#:  [id number]  DATE OF BIRTH:  November 22, 1945  SUBJECTIVE:   Some baseline confusion No issues per RN REVIEW OF SYSTEMS:   Review of Systems  Unable to perform ROS: Medical condition   Tolerating Diet:yes   DRUG ALLERGIES:   Allergies  Allergen Reactions  . Penicillin G Other (See Comments)  . Rofecoxib Other (See Comments)  . Sulfa Antibiotics Other (See Comments)    VITALS:  Blood pressure (!) 110/58, pulse 94, temperature 97.7 F (36.5 C), temperature source Oral, resp. rate 16, height 5\' 1"  (1.549 m), weight 39.6 kg, SpO2 100 %.  PHYSICAL EXAMINATION:   Physical Exam limited exam-- comfort care  GENERAL:  75 y.o.-year-old patient lying in the bed with no acute distress.  LUNGS: Normal breath sounds bilaterally, no wheezing, rales, rhonchi. No use of accessory muscles of respiration.  CARDIOVASCULAR: S1, S2 normal. No murmurs, rubs, or gallops.  EXTREMITIES: No cyanosis, clubbing or edema b/l.    PSYCHIATRIC:  patient is alert  SKIN: No obvious rash, lesion, or ulcer.    LABORATORY PANEL:  CBC No results for input(s): WBC, HGB, HCT, PLT in the last 168 hours.  Chemistries  No results for input(s): NA, K, CL, CO2, GLUCOSE, BUN, CREATININE, CALCIUM, MG, AST, ALT, ALKPHOS, BILITOT in the last 168 hours.  Invalid input(s): GFRCGP Cardiac Enzymes No results for input(s): TROPONINI in the last 168 hours. RADIOLOGY:  No results found. ASSESSMENT AND PLAN:  Ms.Kaetlyn A Jordanis a 75 y.o.femalefemalewith medical history significant fortype 2 diabetes mellitus with most recent hemoglobin A1c 11.8% in December 2021, Alzheimer's dementia, hyperlipidemia, acquired hypothyroidism, hypertension,who was referred by her PCP to the emergency department because of jaundice and low blood pressure.Her husband reported that patient had been eating poorly and had loss significant amount  of weight.  Pancreatic mass with elevated liver enzymes and obstructive jaundice Hypotension Hyponatremia Hypokalemia Hypomagnesemia Type 2 diabetes mellitus Alzheimer's dementia Hypothyroidism Underweight, severe protein calorie malnutrition COVID-19 infection  S/p ERCP with biliary stent placement on 08/14/2020 S/p biliary stent placement on 08/10/2020.  Continue comfort measures.  Awaiting placement to SNF with hospice    CODE STATUS: DNR/DNI DVT Prophylaxis :comfort care  Status is: Inpatient  Remains inpatient appropriate because:Unsafe d/c plan   Dispo: The patient is from: Home              Anticipated d/c is to: SNF to be determined              Anticipated d/c date is: > 3 days              Patient currently is under comfort care. 08/09/2020--Per TOC --- SNF with hospice might be patient's best option at this time. Earliest local facilities can take patients is 10 days from positive test. Discussed with husband and he is agreeable. Sent referral out for review and noted that earliest discharge would be is 1/20 or 1/21.       TOTAL TIME TAKING CARE OF THIS PATIENT: 20 minutes.  >50% time spent on counselling and coordination of care  Note: This dictation was prepared with Dragon dictation along with smaller phrase technology. Any transcriptional errors that result from this process are unintentional.  Fritzi Mandes M.D    [facility]   CC: Primary care physician; Pcp, NoPatient ID: Deborah Thornton, female   DOB: 07-11-1946, 75 y.o.   MRN: [id number]

## 2020-08-13 DIAGNOSIS — K8689 Other specified diseases of pancreas: Secondary | ICD-10-CM | POA: Diagnosis not present

## 2020-08-13 MED ORDER — ORAL CARE MOUTH RINSE
15.0000 mL | Freq: Two times a day (BID) | OROMUCOSAL | Status: DC
  Administered 2020-08-14 – 2020-08-15 (×4): 15 mL via OROMUCOSAL

## 2020-08-13 NOTE — Progress Notes (Signed)
[facility] [place] [facility] [place] NAME: Deborah Thornton    MR#:  [id number]  DATE OF BIRTH:  July 23, 1946  SUBJECTIVE:   Some baseline confusion No issues per RN REVIEW OF SYSTEMS:   Review of Systems  Unable to perform ROS: Medical condition   Tolerating Diet:yes   DRUG ALLERGIES:   Allergies  Allergen Reactions  . Penicillin G Other (See Comments)  . Rofecoxib Other (See Comments)  . Sulfa Antibiotics Other (See Comments)    VITALS:  Blood pressure 115/67, pulse 98, temperature 98.2 F (36.8 C), temperature source Oral, resp. rate 18, height 5\' 1"  (1.549 m), weight 39.6 kg, SpO2 100 %.  PHYSICAL EXAMINATION:   Physical Exam limited exam-- comfort care  GENERAL:  75 y.o.-year-old patient lying in the bed with no acute distress.  LUNGS: Normal breath sounds bilaterally, no wheezing, rales, rhonchi. No use of accessory muscles of respiration.  CARDIOVASCULAR: S1, S2 normal. No murmurs, rubs, or gallops.  EXTREMITIES: No cyanosis, clubbing or edema b/l.    PSYCHIATRIC:  patient is alert  SKIN: No obvious rash, lesion, or ulcer.    LABORATORY PANEL:  CBC No results for input(s): WBC, HGB, HCT, PLT in the last 168 hours.  Chemistries  No results for input(s): NA, K, CL, CO2, GLUCOSE, BUN, CREATININE, CALCIUM, MG, AST, ALT, ALKPHOS, BILITOT in the last 168 hours.  Invalid input(s): GFRCGP Cardiac Enzymes No results for input(s): TROPONINI in the last 168 hours. RADIOLOGY:  No results found. ASSESSMENT AND PLAN:  [name] a 75 y.o.femalefemalewith medical history significant fortype 2 diabetes mellitus with most recent hemoglobin A1c 11.8% in December 2021, Alzheimer's dementia, hyperlipidemia, acquired hypothyroidism, hypertension,who was referred by her PCP to the emergency department because of jaundice and low blood pressure.Her husband reported that patient had been eating poorly and had loss significant amount of  weight.  Pancreatic mass with elevated liver enzymes and obstructive jaundice Hypotension Hyponatremia Hypokalemia Hypomagnesemia Type 2 diabetes mellitus Alzheimer's dementia Hypothyroidism Underweight, severe protein calorie malnutrition COVID-19 infection  S/p ERCP with biliary stent placement on 08/18/2020 S/p biliary stent placement on 08/28/2020.  Continue comfort measures.  Awaiting placement to SNF with hospice    CODE STATUS: DNR/DNI DVT Prophylaxis :comfort care  Status is: Inpatient  Remains inpatient appropriate because:Unsafe d/c plan   Dispo: The patient is from: Home              Anticipated d/c is to: SNF to be determined              Anticipated d/c date is: > 3 days              Patient currently is under comfort care. 08/09/2020--Per TOC --- SNF with hospice might be patient's best option at this time. Earliest local facilities can take patients is 10 days from positive test. Discussed with husband and he is agreeable. Sent referral out for review and noted that earliest discharge would be is 1/20 or 1/21.       TOTAL TIME TAKING CARE OF THIS PATIENT: 20 minutes.  >50% time spent on counselling and coordination of care  Note: This dictation was prepared with Dragon dictation along with smaller phrase technology. Any transcriptional errors that result from this process are unintentional.  Fritzi Mandes M.D    [facility]   CC: Primary care physician; Pcp, NoPatient ID: Deborah Thornton, female   DOB: 03/11/1946, 75 y.o.   MRN: [id number]

## 2020-08-13 NOTE — Plan of Care (Signed)
Pt remains confused, calm and cooperative. Vitals stable. Pt slept most of the day but easy to wake up. Pt daughter in law contacted who provided pass code updated in patient condition. On RA. Telesitter in place. Pt drinking Nepro with aspiration precautions in place. Safety measures in place. Will continue to monitor. Problem: Education: Goal: Knowledge of General Education information will improve Description: Including pain rating scale, medication(s)/side effects and non-pharmacologic comfort measures Outcome: Progressing   Problem: Health Behavior/Discharge Planning: Goal: Ability to manage health-related needs will improve Outcome: Progressing   Problem: Clinical Measurements: Goal: Ability to maintain clinical measurements within normal limits will improve Outcome: Progressing Goal: Will remain free from infection Outcome: Progressing Goal: Diagnostic test results will improve Outcome: Progressing Goal: Respiratory complications will improve Outcome: Progressing Goal: Cardiovascular complication will be avoided Outcome: Progressing

## 2020-08-14 DIAGNOSIS — K8689 Other specified diseases of pancreas: Secondary | ICD-10-CM | POA: Diagnosis not present

## 2020-08-14 NOTE — Progress Notes (Signed)
[facility] [place] at [place] NAME: Deborah Thornton    MR#:  [id number]  DATE OF BIRTH:  05/11/1946  SUBJECTIVE:   Some baseline confusion Continues to eat some and drinks her nepro No issues per RN REVIEW OF SYSTEMS:   Review of Systems  Unable to perform ROS: Medical condition   Tolerating Diet:yes   DRUG ALLERGIES:   Allergies  Allergen Reactions  . Penicillin G Other (See Comments)  . Rofecoxib Other (See Comments)  . Sulfa Antibiotics Other (See Comments)    VITALS:  Blood pressure 118/75, pulse (!) 103, temperature 97.8 F (36.6 C), temperature source Oral, resp. rate 16, height 5\' 1"  (1.549 m), weight 39.6 kg, SpO2 100 %.  PHYSICAL EXAMINATION:   Physical Exam limited exam-- comfort care  GENERAL:  75 y.o.-year-old patient lying in the bed with no acute distress.  LUNGS: Normal breath sounds bilaterally, no wheezing, rales, rhonchi. No use of accessory muscles of respiration.  CARDIOVASCULAR: S1, S2 normal. No murmurs, rubs, or gallops.  EXTREMITIES: No cyanosis, clubbing or edema b/l.    PSYCHIATRIC:  patient is alert  SKIN: No obvious rash, lesion, or ulcer.    LABORATORY PANEL:  CBC No results for input(s): WBC, HGB, HCT, PLT in the last 168 hours.  Chemistries  No results for input(s): NA, K, CL, CO2, GLUCOSE, BUN, CREATININE, CALCIUM, MG, AST, ALT, ALKPHOS, BILITOT in the last 168 hours.  Invalid input(s): GFRCGP Cardiac Enzymes No results for input(s): TROPONINI in the last 168 hours. RADIOLOGY:  No results found. ASSESSMENT AND PLAN:  [name] a 75 y.o.femalefemalewith medical history significant fortype 2 diabetes mellitus with most recent hemoglobin A1c 11.8% in December 2021, Alzheimer's dementia, hyperlipidemia, acquired hypothyroidism, hypertension,who was referred by her PCP to the emergency department because of jaundice and low blood pressure.Her husband reported that patient had been  eating poorly and had loss significant amount of weight.  Pancreatic mass with elevated liver enzymes and obstructive jaundice Hypotension Type 2 diabetes mellitus Alzheimer's dementia Hypothyroidism Underweight, severe protein calorie malnutrition COVID-19 infection  S/p ERCP with biliary stent placement on 08/06/2020 S/p biliary stent placement on 08/13/2020.  Continue comfort measures.  Awaiting placement to SNF with hospice    CODE STATUS: DNR/DNI DVT Prophylaxis :comfort care  Status is: Inpatient  Remains inpatient appropriate because:Unsafe d/c plan   Dispo: The patient is from: Home              Anticipated d/c is to: SNF to be determined              Anticipated d/c date is: likely 1/20              Patient currently is under comfort care. 08/09/2020--Per TOC --- SNF with hospice might be patient's best option at this time. Earliest local facilities can take patients is 10 days from positive test. Discussed with husband and he is agreeable. Sent referral out for review and noted that earliest discharge would be is 1/20 or 1/21.       TOTAL TIME TAKING CARE OF THIS PATIENT: 20 minutes.  >50% time spent on counselling and coordination of care  Note: This dictation was prepared with Dragon dictation along with smaller phrase technology. Any transcriptional errors that result from this process are unintentional.  Fritzi Mandes M.D    [facility]   CC: Primary care physician; Pcp, NoPatient ID: Deborah Thornton, female   DOB: Dec 16, 1945, 75 y.o.  MRN: [id number]

## 2020-08-14 NOTE — Plan of Care (Signed)
Pt lethargic the whole day. She did not open her eyes to voice nor to touch. However, spontaneous movement of legs noted and pt responding when this Nurse cleaning up her mouth. Pt not interested in eating or drinking her scheduled Nepro. Incontinence care provided as needed. Pt daughter in law contacted this Nurse who updated her on pt condition. Tele sitter in place. Pt awaiting placement to SNF with hospice. Safety measures in place. Will continue to monitor. Problem: Education: Goal: Knowledge of General Education information will improve Description: Including pain rating scale, medication(s)/side effects and non-pharmacologic comfort measures Outcome: Progressing   Problem: Health Behavior/Discharge Planning: Goal: Ability to manage health-related needs will improve Outcome: Progressing   Problem: Clinical Measurements: Goal: Ability to maintain clinical measurements within normal limits will improve Outcome: Progressing Goal: Will remain free from infection Outcome: Progressing Goal: Diagnostic test results will improve Outcome: Progressing Goal: Respiratory complications will improve Outcome: Progressing Goal: Cardiovascular complication will be avoided Outcome: Progressing   Problem: Activity: Goal: Risk for activity intolerance will decrease Outcome: Progressing   Problem: Nutrition: Goal: Adequate nutrition will be maintained Outcome: Progressing   Problem: Coping: Goal: Level of anxiety will decrease Outcome: Progressing   Problem: Elimination: Goal: Will not experience complications related to bowel motility Outcome: Progressing Goal: Will not experience complications related to urinary retention Outcome: Progressing   Problem: Pain Managment: Goal: General experience of comfort will improve Outcome: Progressing   Problem: Safety: Goal: Ability to remain free from injury will improve Outcome: Progressing   Problem: Skin Integrity: Goal: Risk for impaired  skin integrity will decrease Outcome: Progressing

## 2020-08-15 DIAGNOSIS — K8689 Other specified diseases of pancreas: Secondary | ICD-10-CM | POA: Diagnosis not present

## 2020-08-15 NOTE — Progress Notes (Signed)
[facility] at [place] NAME: Deborah Thornton    MR#:  [id number]  DATE OF BIRTH:  06-20-1946  SUBJECTIVE:   Some baseline confusion Continues to eat some and drinks her nepro No issues per RN REVIEW OF SYSTEMS:   Review of Systems  Unable to perform ROS: Medical condition   Tolerating Diet:yes   DRUG ALLERGIES:   Allergies  Allergen Reactions  . Penicillin G Other (See Comments)  . Rofecoxib Other (See Comments)  . Sulfa Antibiotics Other (See Comments)    VITALS:  Blood pressure (!) 87/51, pulse (!) 112, temperature 97.7 F (36.5 C), temperature source Axillary, resp. rate 19, height 5\' 1"  (1.549 m), weight 39.6 kg, SpO2 94 %.  PHYSICAL EXAMINATION:   Physical Exam limited exam-- comfort care  GENERAL:  75 y.o.-year-old patient lying in the bed with no acute distress.  LUNGS: Normal breath sounds bilaterally, no wheezing, rales, rhonchi. No use of accessory muscles of respiration.  CARDIOVASCULAR: S1, S2 normal. No murmurs, rubs, or gallops.  EXTREMITIES: No cyanosis, clubbing or edema b/l.    PSYCHIATRIC:  patient is alert  SKIN: No obvious rash, lesion, or ulcer.    LABORATORY PANEL:  CBC No results for input(s): WBC, HGB, HCT, PLT in the last 168 hours.  Chemistries  No results for input(s): NA, K, CL, CO2, GLUCOSE, BUN, CREATININE, CALCIUM, MG, AST, ALT, ALKPHOS, BILITOT in the last 168 hours.  Invalid input(s): GFRCGP Cardiac Enzymes No results for input(s): TROPONINI in the last 168 hours. RADIOLOGY:  No results found. ASSESSMENT AND PLAN:  Ms.Cierah A Jordanis a 75 y.o.femalefemalewith medical history significant fortype 2 diabetes mellitus with most recent hemoglobin A1c 11.8% in December 2021, Alzheimer's dementia, hyperlipidemia, acquired hypothyroidism, hypertension,who was referred by her PCP to the emergency department because of jaundice and low blood pressure.Her husband reported that patient had  been eating poorly and had loss significant amount of weight.  Pancreatic mass with elevated liver enzymes and obstructive jaundice Hypotension Type 2 diabetes mellitus Alzheimer's dementia Hypothyroidism Underweight, severe protein calorie malnutrition COVID-19 infection  S/p ERCP with biliary stent placement on 08/24/20 S/p biliary stent placement on 08/24/20.  Continue comfort measures.  Awaiting placement to SNF with hospice    CODE STATUS: DNR/DNI DVT Prophylaxis :comfort care  Status is: Inpatient  Remains inpatient appropriate because:Unsafe d/c plan   Dispo: The patient is from: Home              Anticipated d/c is to: SNF to be determined              Anticipated d/c date is: likely 1/20              Patient currently is under comfort care. 08/09/2020--Per TOC --- SNF with hospice might be patient's best option at this time. Earliest local facilities can take patients is 10 days from positive test. Discussed with husband and he is agreeable. Sent referral out for review and noted that earliest discharge would be is 1/20 or 1/21. 08/15/2020--per TOC wiaitn gto hear back from [facility] to see if they can take her      TOTAL TIME TAKING CARE OF THIS PATIENT: 20 minutes.  >50% time spent on counselling and coordination of care  Note: This dictation was prepared with Dragon dictation along with smaller phrase technology. Any transcriptional errors that result from this process are unintentional.  Fritzi Mandes M.D    [facility]   CC:  Primary care physician; Pcp, NoPatient ID: Deborah Thornton, female   DOB: November 11, 1945, 75 y.o.   MRN: [id number]

## 2020-08-29 NOTE — Progress Notes (Signed)
08/05/2020 0835  Patient has expired at approximately 0835 this morning.  Confirmed by second RN Jeralyn Ruths.  Notified attending MD Mal Misty, notified family as well.  Dola Argyle, RN

## 2020-08-29 NOTE — Death Summary Note (Signed)
DEATH SUMMARY   Patient Details  Name: Deborah Thornton MRN: [id number] DOB: March 02, 1946  Admission/Discharge Information   Admit Date:  20-Aug-2020  Date of Death: Date of Death: 04-Sep-2020  Time of Death: Time of Death: 0835  Length of Stay: 30-Nov-2022  Referring Physician: Pcp, No   Reason(s) for Hospitalization  Jaundice and low blood pressure  Diagnoses  Preliminary cause of death:  Secondary Diagnoses (including complications and co-morbidities):  Principal Problem:   Pancreatic mass Active Problems:   Essential hypertension   Obstructive jaundice   Acute hyponatremia   Hypokalemia   Acquired hypothyroidism   Protein-calorie malnutrition, severe   Pressure injury of skin, stage II coccygeal decubitus ulcer   COVID-19 virus infection   Brief Hospital Course (including significant findings, care, treatment, and services provided and events leading to death)  Deborah Thornton is a 75 y.o. year old female with medical history significant fortype 2 diabetes mellitus with most recent hemoglobin A1c 11.8% in December 2021, Alzheimer's dementia, hyperlipidemia, acquired hypothyroidism, hypertension,who was referred by her PCP to the emergency department because of jaundice and low blood pressure.  Her husband reported that patient had been eating poorly and had loss significant amount of weight.  She was hypotensive in the hospital and was treated with IV fluids.  She was also found to have pancreatic mass, concerning for pancreatic cancer, with elevated liver enzymes and obstructive jaundice.  CA 19-9 was elevated at 12,702.  She had electrolyte abnormalities including hyponatremia, hypomagnesemia and hypokalemia that were treated.  She was seen in consultation by the gastroenterologist.  She underwent ERCP with biliary stent placement on 08/12/2020.  Given that overall clinical picture connotes a poor prognosis, her husband opted for comfort measures with hospice.  She was transitioned to  comfort care.  She was evaluated by the hospice team.    Initially, plan was to discharge patient to hospice home.  However, she tested positive for COVID-19 infection on the day of planned discharge so discharge plan was aborted.  Her condition deteriorated and she expired on 2020-09-04 at 8:35 AM.  Pertinent Labs and Studies  Significant Diagnostic Studies DG Skull 1-3 Views  Result Date: 08/02/2020 CLINICAL DATA:  MRI clearance, altered mental status EXAM: SKULL - 1-3 VIEW COMPARISON:  None. FINDINGS: No unexpected retained radiopaque metallic foreign body seen within the calvarium and within the facial structures. The paranasal sinuses are clear. No definite facial fracture. IMPRESSION: No unexpected retained metallic foreign body. Electronically Signed   By: Fidela Salisbury MD   On: 08/02/2020 01:51   DG Chest 1 View  Result Date: 08/02/2020 CLINICAL DATA:  Pancreatic mass, screening for MRI EXAM: CHEST  1 VIEW COMPARISON:  07/06/2020 FINDINGS: Single frontal view of the chest demonstrates an unremarkable cardiac silhouette. No airspace disease, effusion, or pneumothorax. No acute bony abnormalities. No radiopaque foreign bodies from the base of the neck through the upper abdomen. IMPRESSION: 1. No acute intrathoracic process. Electronically Signed   By: Randa Ngo M.D.   On: 08/02/2020 01:49   MR ABDOMEN W WO CONTRAST  Result Date: 08/02/2020 CLINICAL DATA:  75 year old female with history of soft tissue mass in the pancreatic head noted on prior CT examination, concerning for pancreatic neoplasm. Follow-up study. EXAM: MRI ABDOMEN WITHOUT AND WITH CONTRAST TECHNIQUE: Multiplanar multisequence MR imaging of the abdomen was performed both before and after the administration of intravenous contrast. CONTRAST:  26mL GADAVIST GADOBUTROL 1 MMOL/ML IV SOLN COMPARISON:  CT the abdomen and pelvis 2020/08/20.  FINDINGS: Comment: Portions of today's examination are significantly limited by extensive patient  respiratory motion. Lower chest: Unremarkable. Hepatobiliary: No discrete cystic or solid hepatic lesions. Severe intra and extrahepatic biliary ductal dilatation. Common bile duct measures 1.6 cm in diameter in the porta hepatis. Gallbladder is severely distended. Gallbladder wall does not appear thickened. There are several small filling defects within the dependent portion of the gallbladder, compatible with tiny gallstones. No definite filling defect in the common bile duct to suggest choledocholithiasis. However, there is abrupt truncation of the common bile duct distally related to a apparent mass in the head of the pancreas (discussed below). Pancreas: In the head of the pancreas there is a poorly defined mass which is T1 isointense, slightly T2 hyperintense, and hypovascular on post gadolinium imaging measuring approximately 2.2 x 1.7 x 2.0 cm (axial image 50 of series 27 and coronal image 38 of series 31). This is causing mass effect upon adjacent structures, most notably causing complete or near complete obstruction of the common bile duct which is markedly dilated proximally. In addition, MRCP images demonstrate severe dilatation of the pancreatic duct which measures up to 6 mm in diameter, as well as extensive side branch ectasia throughout the pancreas. This lesion also demonstrates diffusion restriction on diffusion-weighted images. This lesion comes in close proximity 2 but appears separate from the superior mesenteric artery, as there appears to be an intact intervening fat plane. However, the lesion comes in contact with and significantly narrows the superior mesenteric vein, spleno portal vein and splenic vein. Portal vein is patent. Celiac axis and common hepatic artery appear separate from the lesion. Small amount of peripancreatic T2 signal intensity suggestive of acute pancreatitis. No well-defined peripancreatic fluid collections to suggest pseudocyst at this time. Spleen: Multiple  subcentimeter T1 hypointense, T2 hyperintense nonenhancing or hypovascular lesions scattered throughout the spleen, difficult to characterize (likely to represent a combination of tiny cysts and hemangiomas). Adrenals/Urinary Tract: Subcentimeter T1 hypointense, T2 hyperintense nonenhancing lesion in the interpolar region of the left kidney is compatible with a small simple cyst. Right kidney and bilateral adrenal glands are normal in appearance. No hydroureteronephrosis in the visualized portions of the abdomen. Stomach/Bowel: Visualized portions are unremarkable. Vascular/Lymphatic: Aortic atherosclerosis. Vascular findings pertinent to probable pancreatic malignancy, as detailed above. No definite lymphadenopathy confidently identified in the abdomen on today's motion limited examination. Other: Trace volume of ascites in the pericolic gutters. Retroperitoneal edema, presumably reflective of pancreatitis. Musculoskeletal: No aggressive appearing osseous lesions are noted in the visualized portions of the skeleton. IMPRESSION: 1. 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 This is associated with obstruction of the common bile duct as well as the distal pancreatic duct. This appears to be associated with some acute pancreatitis at this time. This lesion is intimately associated with the superior mesenteric vein, splenoportal confluence and splenic vein, but no definite arterial involvement at this time. Further evaluation with endoscopic ultrasound and consideration for biopsy is recommended. 2. No definitive metastatic disease identified in the abdomen. Electronically Signed   By: Vinnie Langton M.D.   On: 08/02/2020 06:55   CT ABDOMEN PELVIS W CONTRAST  Result Date: 08/29/2020 CLINICAL DATA:  Hypotension and jaundice. EXAM: CT ABDOMEN AND PELVIS WITH CONTRAST TECHNIQUE: Multidetector CT imaging  of the abdomen and pelvis was performed using the standard protocol following bolus administration of intravenous contrast. CONTRAST:  35mL OMNIPAQUE IOHEXOL 300 MG/ML  SOLN  COMPARISON:  August 10, 2007 FINDINGS: Lower chest: A small pericardial effusion is seen. Hepatobiliary: No focal liver abnormality is seen. There is marked severity, predominant central intrahepatic biliary dilatation. Subcentimeter gallstones are seen within the dependent portion of a markedly distended gallbladder. The common bile duct is dilated and measures approximately 1.8 cm. Pancreas: A 2.7 cm x 1.9 cm x 2.1 cm low-attenuation soft tissue mass is seen within the pancreatic head. This represents a new finding when compared to the prior study. Dilatation of the pancreatic duct is also noted (6.1 mm). Spleen: Multiple subcentimeter ill-defined foci of parenchymal low attenuation are seen scattered throughout the spleen. These areas are mildly increased in number when compared to the prior study. An additional 1.2 cm x 1.0 cm ill-defined low-attenuation splenic lesion is seen (axial CT image 17, CT series number 2). Adrenals/Urinary Tract: Adrenal glands are unremarkable. Kidneys are normal in size, without renal calculi or hydronephrosis. A 6 mm cystic appearing area is seen within the mid left kidney. Bladder is unremarkable. Stomach/Bowel: Stomach is within normal limits. The appendix is not clearly visualized. No evidence of bowel dilatation. Noninflamed diverticula are seen throughout the large bowel. Mild diffuse colonic wall thickening is seen, however, it should be noted that the majority of the large bowel is decompressed. Vascular/Lymphatic: Aortic atherosclerosis. No enlarged abdominal or pelvic lymph nodes. Reproductive: Uterus and bilateral adnexa are unremarkable. Other: No abdominal wall hernia or abnormality. A mild amount of abdominal and pelvic free fluid is seen. Musculoskeletal: Degenerative changes seen throughout the  lumbar spine. IMPRESSION: 1. Soft tissue mass within the pancreatic head which represents a new finding when compared to the prior study and is concerning for the presence of a pancreatic neoplasm. MRI correlation is recommended. 2. Marked severity, predominant central intrahepatic biliary dilatation, as well as dilatation of the common bile duct and pancreatic duct. 3. Cholelithiasis. 4. Mild amount of abdominal and pelvic free fluid. 5. Multiple predominantly subcentimeter splenic lesions, as described above. MRI correlation is recommended. Reference: J Am Coll Radiol 2013;10:833-839. 6. Colonic diverticulosis. 7. Aortic atherosclerosis. Aortic Atherosclerosis (ICD10-I70.0). Electronically Signed   By: Virgina Norfolk M.D.   On: 08/03/2020 19:49   DG C-Arm 1-60 Min-No Report  Result Date: 08/15/2020 Fluoroscopy was utilized by the requesting physician.  No radiographic interpretation.   US Abdomen Limited RUQ (LIVER/GB)  Result Date: 07/29/2020 CLINICAL DATA:  Jaundice and elevated LFTs EXAM: ULTRASOUND ABDOMEN LIMITED RIGHT UPPER QUADRANT COMPARISON:  None. FINDINGS: Gallbladder: Gallbladder is well distended with gallstones and gallbladder sludge within. No wall thickness or pericholecystic fluid is noted. Common bile duct: Diameter: 14 mm Liver: Intrahepatic biliary ductal dilatation is noted. Mild increased echogenicity is noted consistent with fatty infiltration. No mass lesion is seen. Portal vein is patent on color Doppler imaging with normal direction of blood flow towards the liver. Other: Imaging of the pancreas was performed given the dilatation of the common bile duct. There is prominence of the pancreatic duct is well although the head and uncinate process for not well visualized. IMPRESSION: Gallbladder sludge and gallstones. Extrahepatic and intrahepatic biliary ductal dilatation. Given the clinical findings, CT of the abdomen and pelvis with contrast is recommended for further evaluation.  Electronically Signed   By: Inez Catalina M.D.   On: 08/21/2020 16:44    Microbiology Recent Results (from the past 240 hour(s))  Resp Panel by RT-PCR (Flu A&B, Covid) Nasopharyngeal Swab     Status: Abnormal   Collection Time: 08/07/20  1:33 PM  Specimen: Nasopharyngeal Swab; Nasopharyngeal(NP) swabs in vial transport medium  Result Value Ref Range Status   SARS Coronavirus 2 by RT PCR POSITIVE (A) NEGATIVE Final    Comment: RESULT CALLED TO, READ BACK BY AND VERIFIED WITH: SHEREA COVINGTON ADAMS 08/07/20 AT 1437 BY ACR (NOTE) SARS-CoV-2 target nucleic acids are DETECTED.  The SARS-CoV-2 RNA is generally detectable in upper respiratory specimens during the acute phase of infection. Positive results are indicative of the presence of the identified virus, but do not rule out bacterial infection or co-infection with other pathogens not detected by the test. Clinical correlation with patient history and other diagnostic information is necessary to determine patient infection status. The expected result is Negative.  Fact Sheet for Patients: EntrepreneurPulse.com.au  Fact Sheet for Healthcare Providers: IncredibleEmployment.be  This test is not yet approved or cleared by the [place] FDA and  has been authorized for detection and/or diagnosis of SARS-CoV-2 by FDA under an Emergency Use Authorization (EUA).  This EUA will remain in effect (meaning this  test can be used) for the duration of  the COVID-19 declaration under Section 564(b)(1) of the Act, 21 U.S.C. section 360bbb-3(b)(1), unless the authorization is terminated or revoked sooner.     Influenza A by PCR NEGATIVE NEGATIVE Final   Influenza B by PCR NEGATIVE NEGATIVE Final    Comment: (NOTE) The Xpert Xpress SARS-CoV-2/FLU/RSV plus assay is intended as an aid in the diagnosis of influenza from Nasopharyngeal swab specimens and should not be used as a sole basis for treatment. Nasal  washings and aspirates are unacceptable for Xpert Xpress SARS-CoV-2/FLU/RSV testing.  Fact Sheet for Patients: EntrepreneurPulse.com.au  Fact Sheet for Healthcare Providers: IncredibleEmployment.be  This test is not yet approved or cleared by the [place] FDA and has been authorized for detection and/or diagnosis of SARS-CoV-2 by FDA under an Emergency Use Authorization (EUA). This EUA will remain in effect (meaning this test can be used) for the duration of the COVID-19 declaration under Section 564(b)(1) of the Act, 21 U.S.C. section 360bbb-3(b)(1), unless the authorization is terminated or revoked.  Performed at [facility], [address], [place], [place] [address]     Lab Basic Metabolic Panel: No results for input(s): NA, K, CL, CO2, GLUCOSE, BUN, CREATININE, CALCIUM, MG, PHOS in the last 168 hours. Liver Function Tests: No results for input(s): AST, ALT, ALKPHOS, BILITOT, PROT, ALBUMIN in the last 168 hours. No results for input(s): LIPASE, AMYLASE in the last 168 hours. No results for input(s): AMMONIA in the last 168 hours. CBC: No results for input(s): WBC, NEUTROABS, HGB, HCT, MCV, PLT in the last 168 hours. Cardiac Enzymes: No results for input(s): CKTOTAL, CKMB, CKMBINDEX, TROPONINI in the last 168 hours. Sepsis Labs: No results for input(s): PROCALCITON, WBC, LATICACIDVEN in the last 168 hours.  Procedures/Operations  ERCP with biliary stent placement on 08/15/2020   Sherline Eberwein 08/17/2020, 8:09 AM

## 2020-08-29 DEATH — deceased

## 2021-06-17 IMAGING — US US ABDOMEN LIMITED
1 series · 14 of 25 positions shown · non-contrast
Comparison: None.

CLINICAL DATA: Jaundice and elevated LFTs

EXAM:
ULTRASOUND ABDOMEN LIMITED RIGHT UPPER QUADRANT

[Series 1: us abdomen limited ruq (liver/gb) · 14 of 46 slices shown]
[im 1/46]
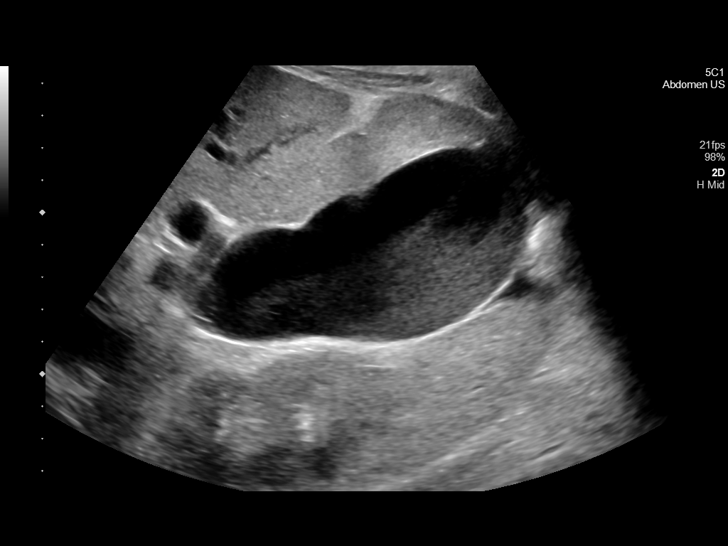
[im 4/46]
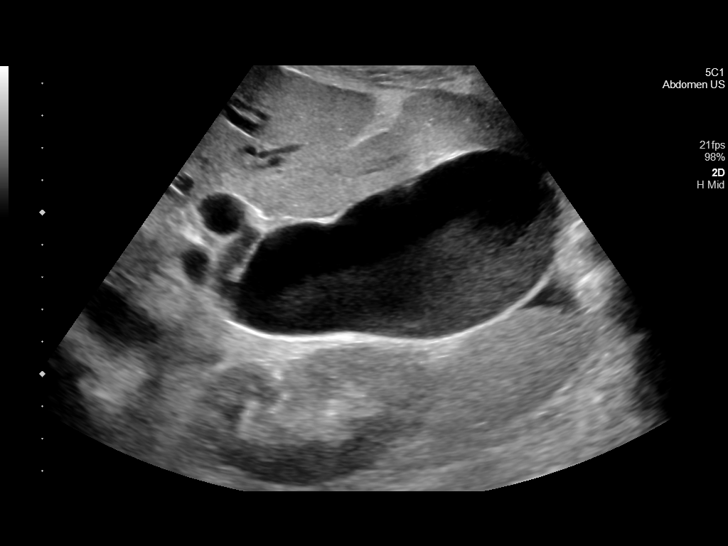
[im 8/46]
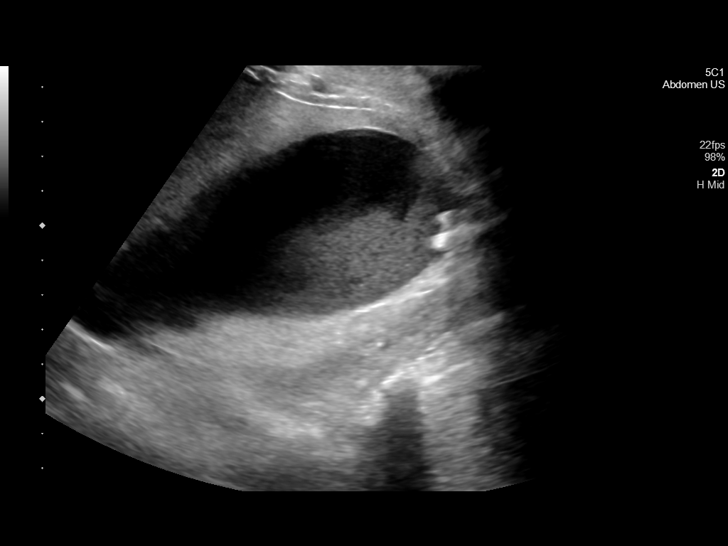
[im 12/46]
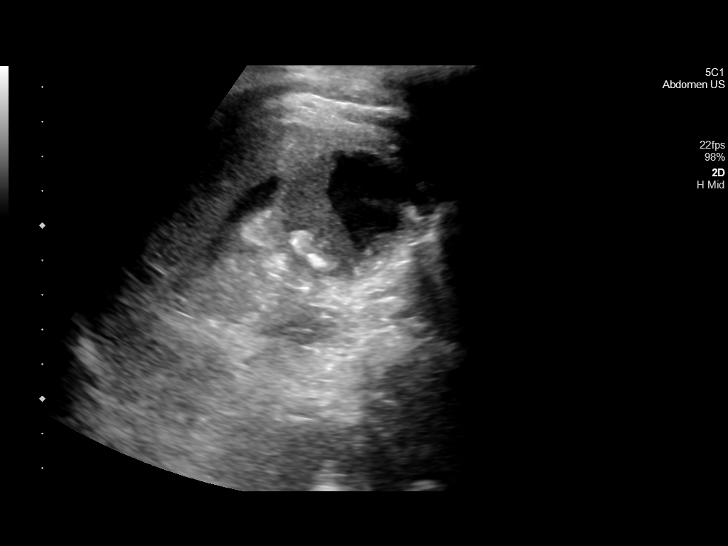
[im 16/46]
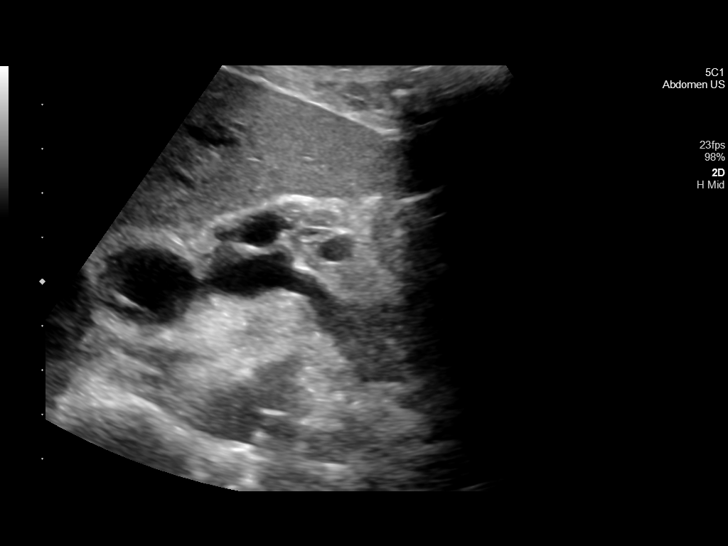
[im 17/46]
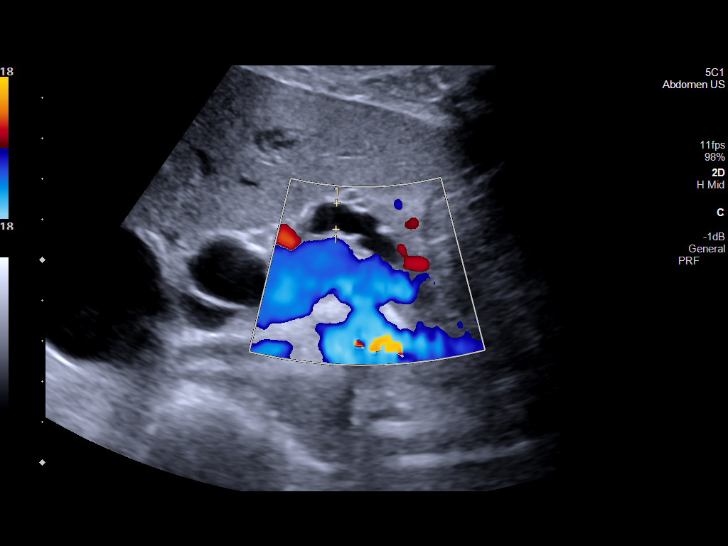
[im 21/46]
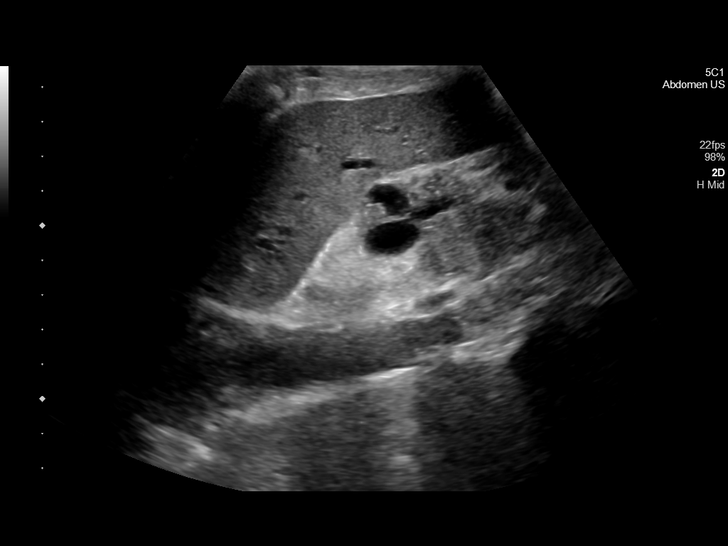
[im 25/46]
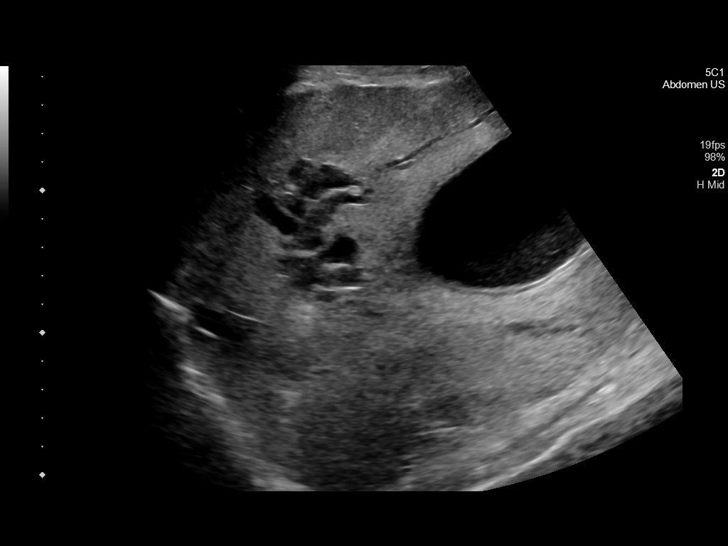
[im 29/46]
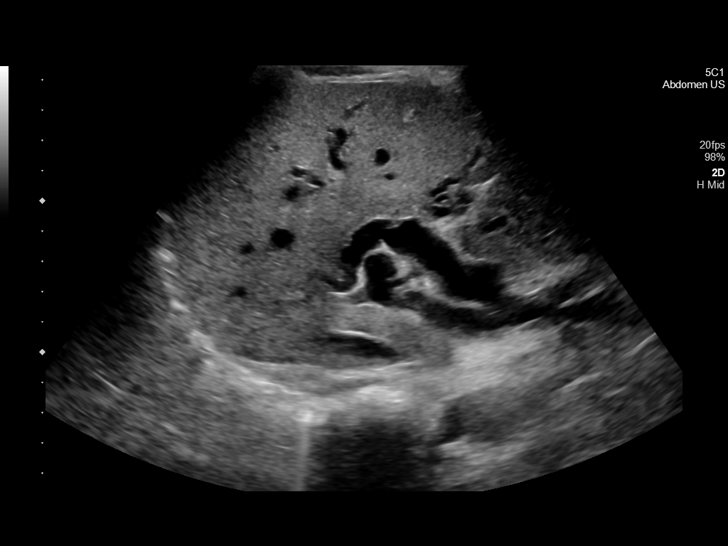
[im 31/46]
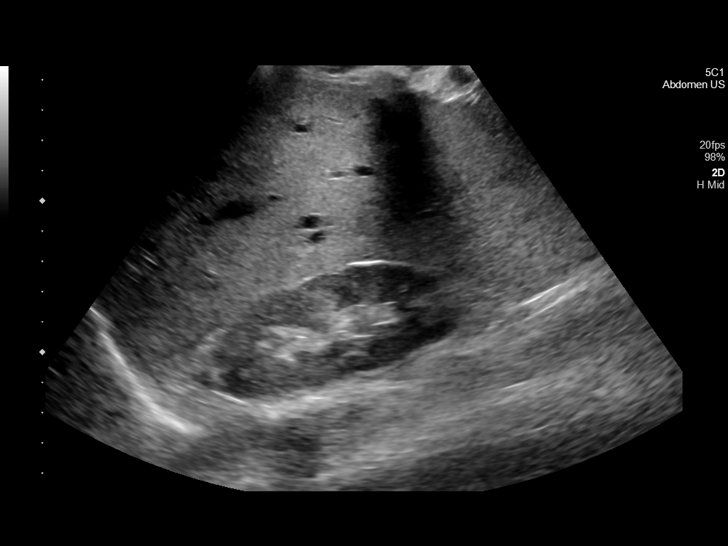
[im 34/46]
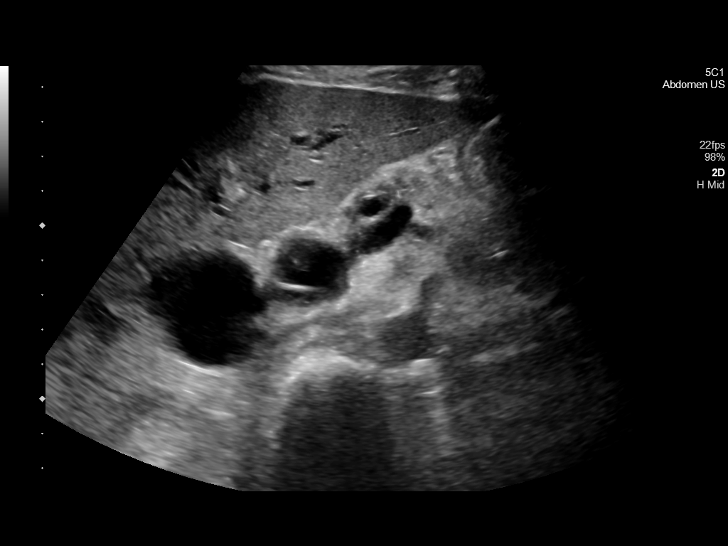
[im 38/46]
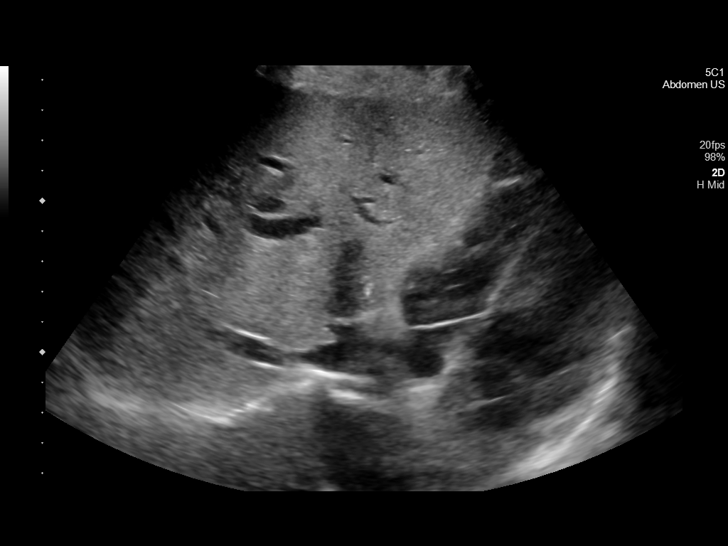
[im 42/46]
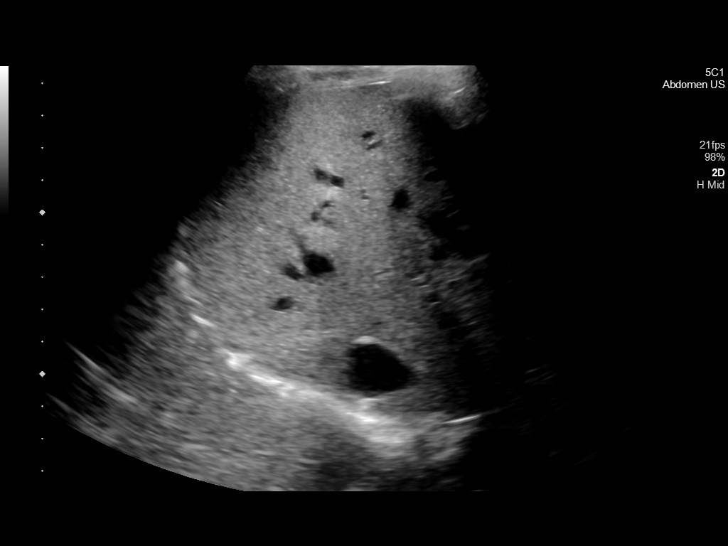
[im 46/46]
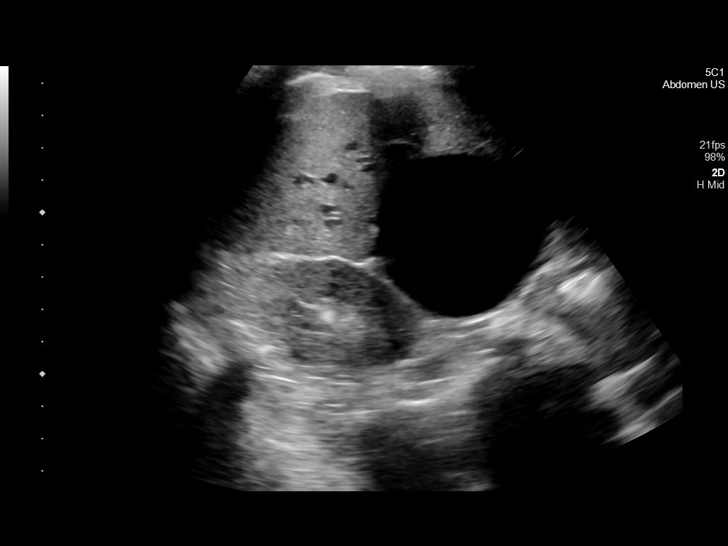

[14 of 25 positions shown; findings below may reference images not displayed]

FINDINGS: Gallbladder:

Gallbladder is well distended with gallstones and gallbladder sludge
within. No wall thickness or pericholecystic fluid is noted.

Common bile duct:

Diameter: 14 mm

Liver:

Intrahepatic biliary ductal dilatation is noted. Mild increased
echogenicity is noted consistent with fatty infiltration. No mass
lesion is seen. Portal vein is patent on color Doppler imaging with
normal direction of blood flow towards the liver.

Other: Imaging of the pancreas was performed given the dilatation of
the common bile duct. There is prominence of the pancreatic duct is
well although the head and uncinate process for not well visualized.
IMPRESSION: Gallbladder sludge and gallstones.

Extrahepatic and intrahepatic biliary ductal dilatation. Given the
clinical findings, CT of the abdomen and pelvis with contrast is
recommended for further evaluation.

## 2021-06-18 IMAGING — CR DG SKULL 1-3V
2 series · 2 of 2 positions shown · non-contrast
Comparison: None.

CLINICAL DATA: MRI clearance, altered mental status

EXAM:
SKULL - 1-3 VIEW

[skull lat]
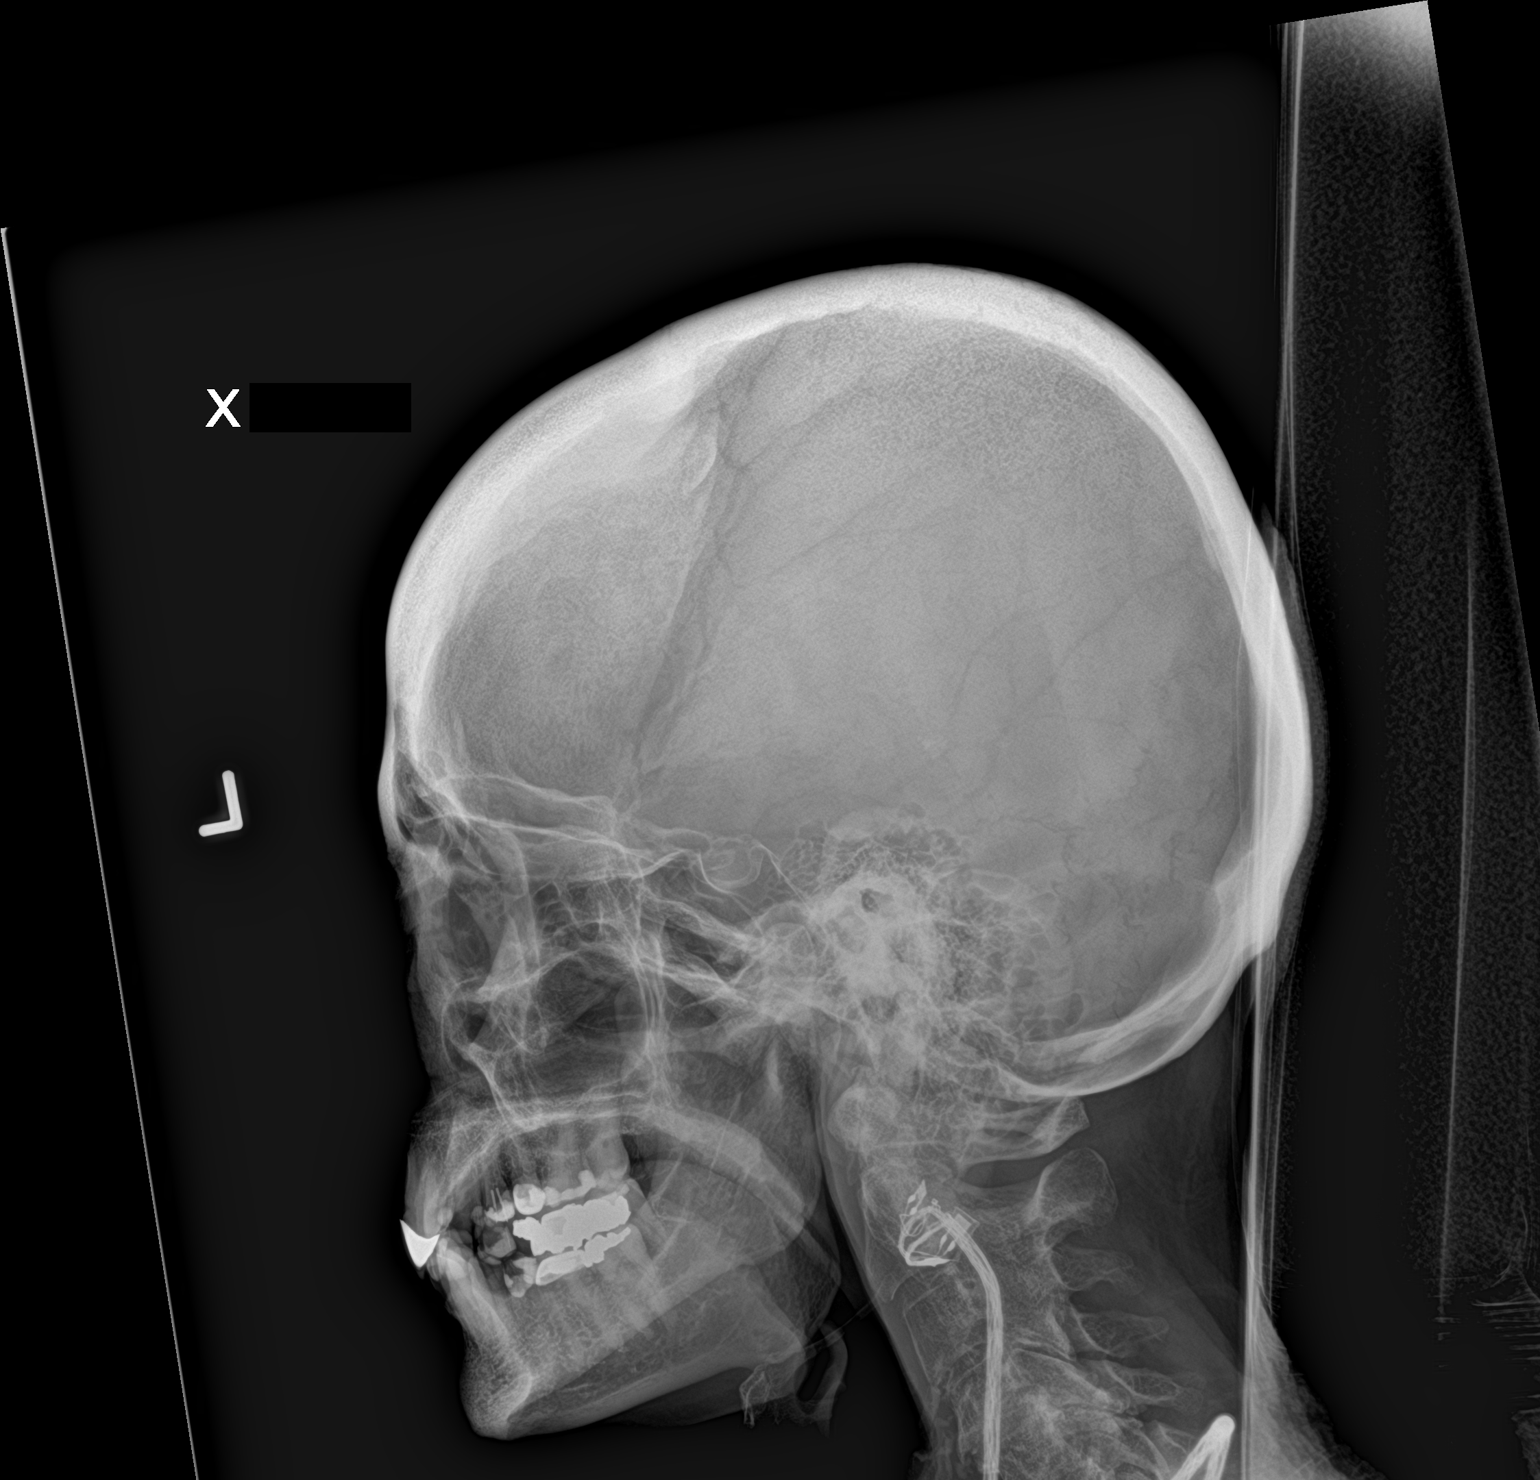

[skull pa]
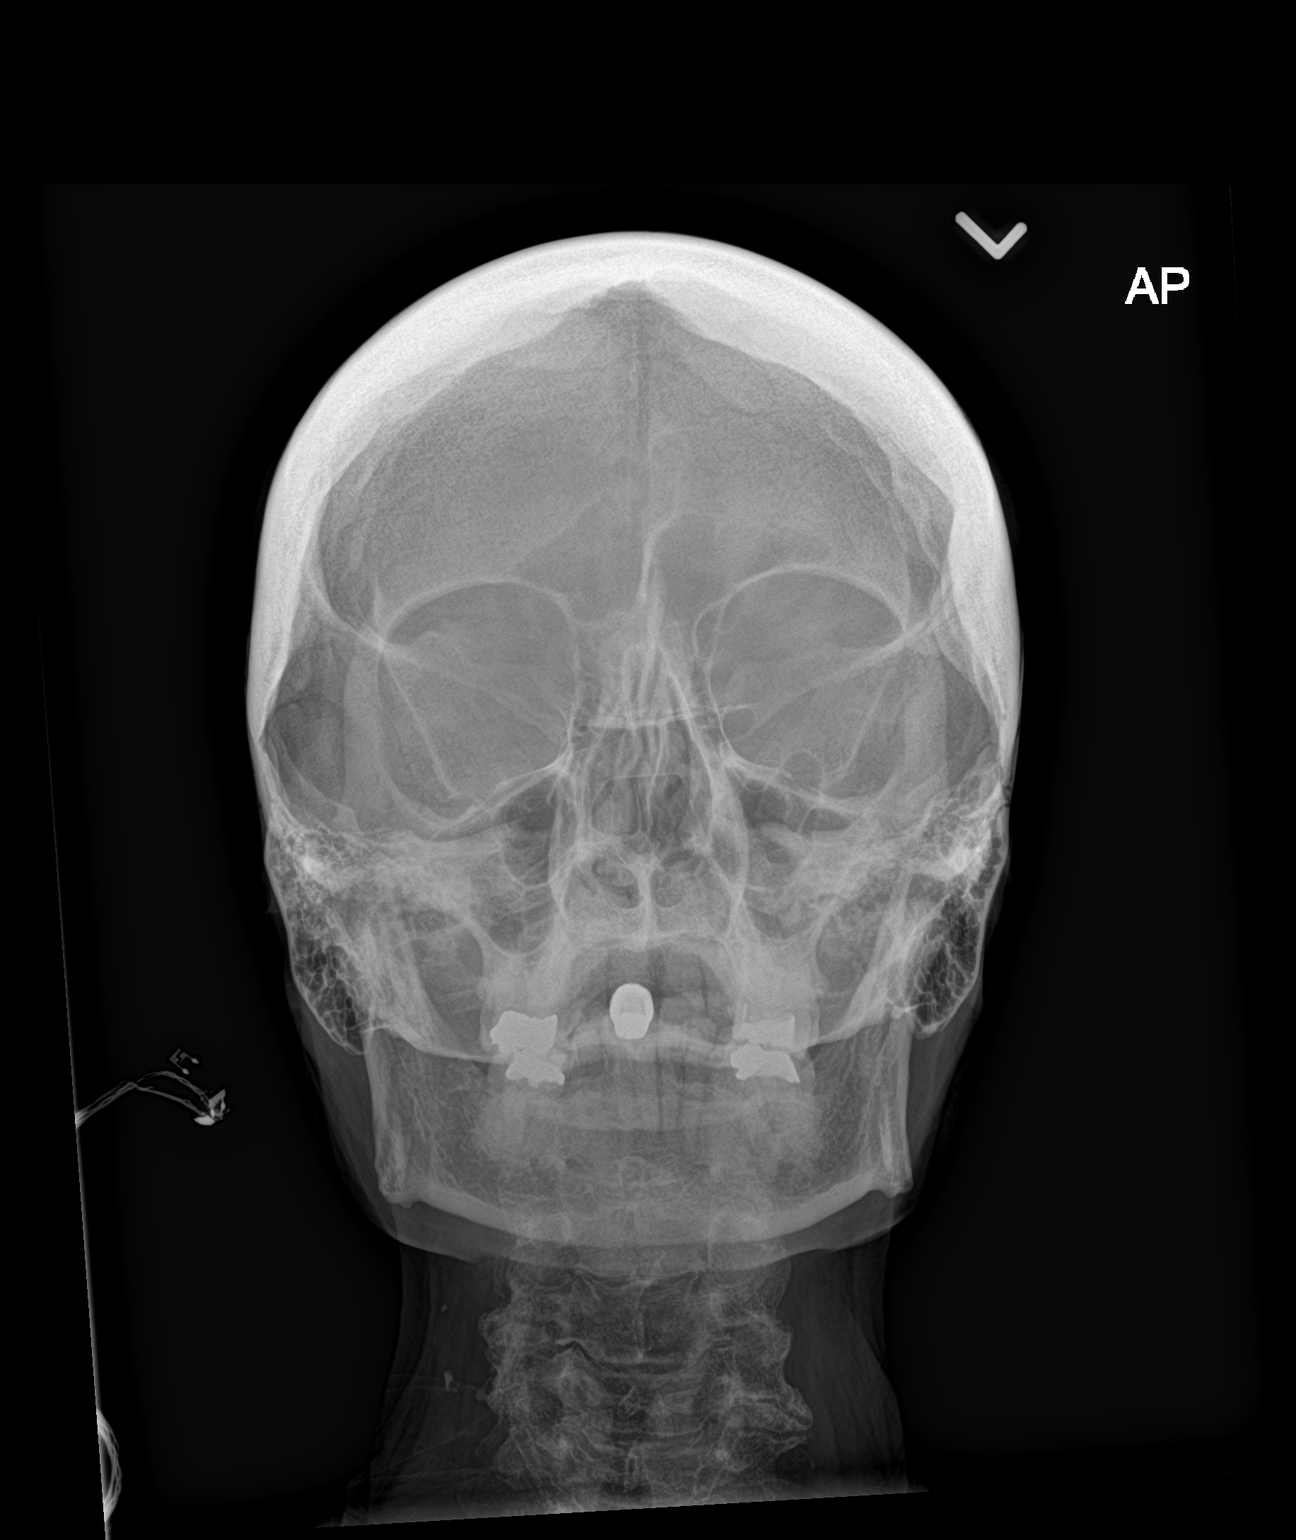

[2 of 2 positions shown; findings below may reference images not displayed]

FINDINGS: No unexpected retained radiopaque metallic foreign body seen within
the calvarium and within the facial structures. The paranasal
sinuses are clear. No definite facial fracture.
IMPRESSION: No unexpected retained metallic foreign body.
